# Patient Record
Sex: Female | Born: 1995
Health system: Southern US, Community
[De-identification: ages and names within clinical notes are randomized; demographics above are authoritative.]

## PROBLEM LIST (undated history)

## (undated) DIAGNOSIS — D509 Iron deficiency anemia, unspecified: Secondary | ICD-10-CM

## (undated) DIAGNOSIS — R519 Headache, unspecified: Secondary | ICD-10-CM

## (undated) DIAGNOSIS — G90A Postural orthostatic tachycardia syndrome (POTS): Secondary | ICD-10-CM

## (undated) DIAGNOSIS — E282 Polycystic ovarian syndrome: Secondary | ICD-10-CM

## (undated) DIAGNOSIS — E039 Hypothyroidism, unspecified: Secondary | ICD-10-CM

## (undated) DIAGNOSIS — I498 Other specified cardiac arrhythmias: Secondary | ICD-10-CM

## (undated) DIAGNOSIS — R000 Tachycardia, unspecified: Secondary | ICD-10-CM

## (undated) DIAGNOSIS — I951 Orthostatic hypotension: Secondary | ICD-10-CM

## (undated) DIAGNOSIS — N809 Endometriosis, unspecified: Secondary | ICD-10-CM

## (undated) DIAGNOSIS — K219 Gastro-esophageal reflux disease without esophagitis: Secondary | ICD-10-CM

## (undated) DIAGNOSIS — I9589 Other hypotension: Secondary | ICD-10-CM

## (undated) HISTORY — PX: ENDOMETRIAL ABLATION: SHX621

## (undated) HISTORY — PX: TONSILLECTOMY: SUR1361

## (undated) HISTORY — DX: Hypothyroidism, unspecified: E03.9

## (undated) HISTORY — DX: Iron deficiency anemia, unspecified: D50.9

---
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
## 2014-11-22 ENCOUNTER — Encounter (HOSPITAL_COMMUNITY): Payer: Self-pay | Admitting: *Deleted

## 2014-11-22 ENCOUNTER — Emergency Department (HOSPITAL_COMMUNITY)
Admission: EM | Admit: 2014-11-22 | Discharge: 2014-11-23 | Disposition: A | Discharge status: Home / Self Care | Payer: BLUE CROSS/BLUE SHIELD | Attending: Emergency Medicine | Admitting: Emergency Medicine

## 2014-11-22 DIAGNOSIS — Y92009 Unspecified place in unspecified non-institutional (private) residence as the place of occurrence of the external cause: Secondary | ICD-10-CM | POA: Diagnosis not present

## 2014-11-22 DIAGNOSIS — S0990XA Unspecified injury of head, initial encounter: Secondary | ICD-10-CM

## 2014-11-22 DIAGNOSIS — R5383 Other fatigue: Secondary | ICD-10-CM | POA: Diagnosis not present

## 2014-11-22 DIAGNOSIS — Y9389 Activity, other specified: Secondary | ICD-10-CM | POA: Diagnosis not present

## 2014-11-22 DIAGNOSIS — W01198A Fall on same level from slipping, tripping and stumbling with subsequent striking against other object, initial encounter: Secondary | ICD-10-CM | POA: Insufficient documentation

## 2014-11-22 DIAGNOSIS — S99922A Unspecified injury of left foot, initial encounter: Secondary | ICD-10-CM | POA: Insufficient documentation

## 2014-11-22 DIAGNOSIS — Y998 Other external cause status: Secondary | ICD-10-CM | POA: Diagnosis not present

## 2014-11-22 HISTORY — DX: Orthostatic hypotension: I95.1

## 2014-11-22 HISTORY — DX: Postural orthostatic tachycardia syndrome (POTS): G90.A

## 2014-11-22 HISTORY — DX: Other specified cardiac arrhythmias: I49.8

## 2014-11-22 HISTORY — DX: Tachycardia, unspecified: R00.0

## 2014-11-22 NOTE — ED Notes (Signed)
Pt states that she slipped on ice in the floor this evening hitting her head against the bottom of the refrigerator, pt c/o pain to left foot and frontal head pain, pt states that after the fall when she started to get up she felt tired and nauseous,

## 2014-11-22 NOTE — ED Provider Notes (Signed)
CSN: [id number]     Arrival date & time 11/22/14  2115 History   First MD Initiated Contact with Patient 11/22/14 2336     Chief Complaint  Patient presents with  . Fall     (Consider location/radiation/quality/duration/timing/severity/associated sxs/prior Treatment) HPI   Joy Heath is a 19 y.o. female who presents to the Emergency Department complaining of a mechanical fall inside her house that occurred approximately 3 hours prior to ED arrival. She states that slipped on some ice in the floor and struck her left foot on the refrigerator and struck the front of her head on the metal strip between the carpet and the tile flooring.  She denies LOC, neck pain, vomiting, dizziness and visual changes.  Initially, she states that she felt nauseous and tired, but now symptoms have resolved.  She has not taken any medications for her symptoms   Past Medical History  Diagnosis Date  . POTS (postural orthostatic tachycardia syndrome)    Past Surgical History  Procedure Laterality Date  . Endometrial ablation    . Tonsillectomy     No family history on file. History  Substance Use Topics  . Smoking status: Never Smoker   . Smokeless tobacco: Not on file  . Alcohol Use: No   OB History    No data available     Review of Systems  Constitutional: Positive for fatigue. Negative for fever, activity change and appetite change.  HENT: Negative for facial swelling.   Eyes: Negative for pain and visual disturbance.  Respiratory: Negative for shortness of breath.   Cardiovascular: Negative for chest pain.  Gastrointestinal: Positive for nausea. Negative for vomiting and abdominal pain.  Musculoskeletal: Negative for neck pain and neck stiffness.  Skin: Negative for rash and wound.  Neurological: Positive for headaches. Negative for dizziness, seizures, syncope, facial asymmetry, speech difficulty, weakness and numbness.  Psychiatric/Behavioral: Negative for confusion and decreased  concentration.  All other systems reviewed and are negative.     Allergies  Review of patient's allergies indicates no known allergies.  Home Medications   Prior to Admission medications   Not on File   BP 128/70 mmHg  Pulse 84  Temp(Src) 98.2 F (36.8 C) (Oral)  Resp 24  Ht 5\' 5"  (1.651 m)  Wt 230 lb (104.327 kg)  BMI 38.27 kg/m2  SpO2 100%  LMP 11/09/2014   Physical Exam  Constitutional: She is oriented to person, place, and time. She appears well-developed and well-nourished. No distress.  HENT:  Head: Normocephalic and atraumatic.  Mouth/Throat: Oropharynx is clear and moist.  Eyes: Conjunctivae and EOM are normal. Pupils are equal, round, and reactive to light.  Neck: Normal range of motion. Neck supple.  Cardiovascular: Normal rate, regular rhythm, normal heart sounds and intact distal pulses.   No murmur heard. Pulmonary/Chest: Effort normal and breath sounds normal. No respiratory distress.  Musculoskeletal: Normal range of motion. She exhibits no edema.  Neurological: She is alert and oriented to person, place, and time.  Skin: Skin is warm and dry.  Psychiatric: She has a normal mood and affect. Her behavior is normal. Thought content normal.  Nursing note and vitals reviewed.   ED Course  Procedures (including critical care time) Labs Review Labs Reviewed - No data to display  Imaging Review No results found.   EKG Interpretation None      MDM   Final diagnoses:  Minor head injury without loss of consciousness, initial encounter    Pt is well appearing, vitals  stable.  Steady gait.  No focal neuro deficits.  Requesting to go home, states that she is feeling better and symptoms have resolved.  Clinical suspicion for concussion or subdural hematoma are low.  I have discussed possible risks of head injury and concussion symptoms with the patient and she agrees to return here for any worsening symptoms, family also agree to closely observe.      Kem Parkinson, PA-C 05/16/74 [id number]  Delora Fuel, MD 54/98/26 [id number]

## 2014-11-23 NOTE — ED Notes (Signed)
Patient verbalizes understanding of discharge instructions, home care and follow up care. Patient out of department at this time with family. 

## 2014-11-23 NOTE — Discharge Instructions (Signed)
Concussion A concussion is a brain injury. It is caused by:  A hit to the head.  A quick and sudden movement (jolt) of the head or neck. A concussion is usually not life threatening. Even so, it can cause serious problems. If you had a concussion before, you may have concussion-like problems after a hit to your head. HOME CARE General Instructions  Follow your doctor's directions carefully.  Take medicines only as told by your doctor.  Only take medicines your doctor says are safe.  Do not drink alcohol until your doctor says it is okay. Alcohol and some drugs can slow down healing. They can also put you at risk for further injury.  If you are having trouble remembering things, write them down.  Try to do one thing at a time if you get distracted easily. For example, do not watch TV while making dinner.  Talk to your family members or close friends when making important decisions.  Follow up with your doctor as told.  Watch your symptoms. Tell others to do the same. Serious problems can sometimes happen after a concussion. Older adults are more likely to have these problems.  Tell your teachers, school nurse, school counselor, coach, Product/process development scientist, or work Freight forwarder about your concussion. Tell them about what you can or cannot do. They should watch to see if:  It gets even harder for you to pay attention or concentrate.  It gets even harder for you to remember things or learn new things.  You need more time than normal to finish things.  You become annoyed (irritable) more than before.  You are not able to deal with stress as well.  You have more problems than before.  Rest. Make sure you:  Get plenty of sleep at night.  Go to sleep early.  Go to bed at the same time every day. Try to wake up at the same time.  Rest during the day.  Take naps when you feel tired.  Limit activities where you have to think a lot or concentrate. These include:  Doing  homework.  Doing work related to a job.  Watching TV.  Using the computer. Returning To Your Regular Activities Return to your normal activities slowly, not all at once. You must give your body and brain enough time to heal.   Do not play sports or do other athletic activities until your doctor says it is okay.  Ask your doctor when you can drive, ride a bicycle, or work other vehicles or machines. Never do these things if you feel dizzy.  Ask your doctor about when you can return to work or school. Preventing Another Concussion It is very important to avoid another brain injury, especially before you have healed. In rare cases, another injury can lead to permanent brain damage, brain swelling, or death. The risk of this is greatest during the first 7-10 days after your injury. Avoid injuries by:   Wearing a seat belt when riding in a car.  Not drinking too much alcohol.  Avoiding activities that could lead to a second concussion (such as contact sports).  Wearing a helmet when doing activities like:  Biking.  Skiing.  Skateboarding.  Skating.  Making your home safer by:  Removing things from the floor or stairways that could make you trip.  Using grab bars in bathrooms and handrails by stairs.  Placing non-slip mats on floors and in bathtubs.  Improve lighting in dark areas. GET HELP IF:  It  gets even harder for you to pay attention or concentrate. °· It gets even harder for you to remember things or learn new things. °· You need more time than normal to finish things. °· You become annoyed (irritable) more than before. °· You are not able to deal with stress as well. °· You have more problems than before. °· You have problems keeping your balance. °· You are not able to react quickly when you should. °Get help if you have any of these problems for more than 2 weeks:  °· Lasting (chronic) headaches. °· Dizziness or trouble balancing. °· Feeling sick to your stomach  (nausea). °· Seeing (vision) problems. °· Being affected by noises or light more than normal. °· Feeling sad, low, down in the dumps, blue, gloomy, or empty (depressed). °· Mood changes (mood swings). °· Feeling of fear or nervousness about what may happen (anxiety). °· Feeling annoyed. °· Memory problems. °· Problems concentrating or paying attention. °· Sleep problems. °· Feeling tired all the time. °GET HELP RIGHT AWAY IF:  °· You have bad headaches or your headaches get worse. °· You have weakness (even if it is in one hand, leg, or part of the face). °· You have loss of feeling (numbness). °· You feel off balance. °· You keep throwing up (vomiting). °· You feel tired. °· One black center of your eye (pupil) is larger than the other. °· You twitch or shake violently (convulse). °· Your speech is not clear (slurred). °· You are more confused, easily angered (agitated), or annoyed than before. °· You have more trouble resting than before. °· You are unable to recognize people or places. °· You have neck pain. °· It is difficult to wake you up. °· You have unusual behavior changes. °· You pass out (lose consciousness). °MAKE SURE YOU:  °· Understand these instructions. °· Will watch your condition. °· Will get help right away if you are not doing well or get worse. °Document Released: 05/03/2009 Document Revised: 09/29/2013 Document Reviewed: 12/05/2012 °[employer]® Patient Information ©2015 [employer]. This information is not intended to replace advice given to you by your health care provider. Make sure you discuss any questions you have with your health care provider. ° °

## 2014-11-23 NOTE — ED Notes (Signed)
Patient verbalizes understanding of discharge instructions, home care and follow up care if needed. Patient out of department at this time with family. 

## 2015-10-09 ENCOUNTER — Emergency Department (HOSPITAL_COMMUNITY): Payer: BLUE CROSS/BLUE SHIELD

## 2015-10-09 ENCOUNTER — Encounter (HOSPITAL_COMMUNITY): Payer: Self-pay | Admitting: *Deleted

## 2015-10-09 ENCOUNTER — Emergency Department (HOSPITAL_COMMUNITY)
Admission: EM | Admit: 2015-10-09 | Discharge: 2015-10-10 | Disposition: A | Discharge status: Home / Self Care | Payer: BLUE CROSS/BLUE SHIELD | Attending: Emergency Medicine | Admitting: Emergency Medicine

## 2015-10-09 DIAGNOSIS — R112 Nausea with vomiting, unspecified: Secondary | ICD-10-CM | POA: Diagnosis not present

## 2015-10-09 DIAGNOSIS — Z79899 Other long term (current) drug therapy: Secondary | ICD-10-CM | POA: Diagnosis not present

## 2015-10-09 DIAGNOSIS — R197 Diarrhea, unspecified: Secondary | ICD-10-CM | POA: Insufficient documentation

## 2015-10-09 DIAGNOSIS — Z7984 Long term (current) use of oral hypoglycemic drugs: Secondary | ICD-10-CM | POA: Insufficient documentation

## 2015-10-09 DIAGNOSIS — R1084 Generalized abdominal pain: Secondary | ICD-10-CM | POA: Insufficient documentation

## 2015-10-09 HISTORY — DX: Polycystic ovarian syndrome: E28.2

## 2015-10-09 LAB — CBC
HCT: 38.3 % (ref 36.0–46.0)
Hemoglobin: 12.6 g/dL (ref 12.0–15.0)
MCH: 26.1 pg (ref 26.0–34.0)
MCHC: 32.9 g/dL (ref 30.0–36.0)
MCV: 79.3 fL (ref 78.0–100.0)
PLATELETS: 258 10*3/uL (ref 150–400)
RBC: 4.83 MIL/uL (ref 3.87–5.11)
RDW: 13.3 % (ref 11.5–15.5)
WBC: 10.2 10*3/uL (ref 4.0–10.5)

## 2015-10-09 LAB — URINALYSIS, ROUTINE W REFLEX MICROSCOPIC
Bilirubin Urine: NEGATIVE
Glucose, UA: NEGATIVE mg/dL
HGB URINE DIPSTICK: NEGATIVE
KETONES UR: NEGATIVE mg/dL
Leukocytes, UA: NEGATIVE
Nitrite: NEGATIVE
PROTEIN: NEGATIVE mg/dL
Specific Gravity, Urine: 1.01 (ref 1.005–1.030)
pH: 6 (ref 5.0–8.0)

## 2015-10-09 LAB — COMPREHENSIVE METABOLIC PANEL
ALK PHOS: 45 U/L (ref 38–126)
ALT: 18 U/L (ref 14–54)
AST: 16 U/L (ref 15–41)
Albumin: 4.3 g/dL (ref 3.5–5.0)
Anion gap: 8 (ref 5–15)
BUN: 12 mg/dL (ref 6–20)
CALCIUM: 9.1 mg/dL (ref 8.9–10.3)
CO2: 20 mmol/L — AB (ref 22–32)
CREATININE: 0.72 mg/dL (ref 0.44–1.00)
Chloride: 105 mmol/L (ref 101–111)
Glucose, Bld: 85 mg/dL (ref 65–99)
Potassium: 3.6 mmol/L (ref 3.5–5.1)
SODIUM: 133 mmol/L — AB (ref 135–145)
Total Bilirubin: 0.4 mg/dL (ref 0.3–1.2)
Total Protein: 8.2 g/dL — ABNORMAL HIGH (ref 6.5–8.1)

## 2015-10-09 LAB — LIPASE, BLOOD: Lipase: 27 U/L (ref 11–51)

## 2015-10-09 LAB — POC URINE PREG, ED: PREG TEST UR: NEGATIVE

## 2015-10-09 MED ORDER — DIATRIZOATE MEGLUMINE & SODIUM 66-10 % PO SOLN
ORAL | Status: AC
  Administered 2015-10-10: 15 mL
  Filled 2015-10-09: qty 30

## 2015-10-09 MED ORDER — ONDANSETRON HCL 4 MG/2ML IJ SOLN
4.0000 mg | Freq: Once | INTRAMUSCULAR | Status: AC
  Administered 2015-10-09: 4 mg via INTRAVENOUS
  Filled 2015-10-09: qty 2

## 2015-10-09 NOTE — ED Notes (Signed)
Pt reports abdominal pain and dry heaving x 3 weeks. Pt also reports having bouts of diarrhea almost everyday x 3 weeks. Mother and pt states that the pt has lost about 30 lbs in 3 weeks. Soonest the patient could see Dr. Nevada Crane is May 22 nd, 2017.

## 2015-10-09 NOTE — ED Provider Notes (Signed)
CSN: [id number]     Arrival date & time 10/09/15  2228 History   First MD Initiated Contact with Patient 10/09/15 2234     Chief Complaint  Patient presents with  . Abdominal Pain     (Consider location/radiation/quality/duration/timing/severity/associated sxs/prior Treatment) Patient is a 20 y.o. female presenting with abdominal pain. The history is provided by the patient. No language interpreter was used.  Abdominal Pain Pain location:  Generalized Pain quality: bloating, burning, dull and sharp   Pain radiates to:  Back Pain severity:  Severe Onset quality:  Gradual Duration:  3 weeks Timing:  Constant Progression:  Worsening Chronicity:  New Relieved by:  Nothing Worsened by:  Eating Ineffective treatments:  Acetaminophen Associated symptoms: chills, diarrhea, nausea and vomiting    Joy Heath is a 20 y.o. female who presents to the ED with n/v/d that started 3 weeks ago. She reports that she vomits 2 or 3 times a day, but nothing really comes up, and then she has one dark brown watery stool each day. She reports a weight loss of about 30 pounds. 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 Tonight she felt worse so her mother brought her to the ED.  Past Medical History  Diagnosis Date  . POTS (postural orthostatic tachycardia syndrome)   . PCOS (polycystic ovarian syndrome)    Past Surgical History  Procedure Laterality Date  . Endometrial ablation    . Tonsillectomy     History reviewed. No pertinent family history. Social History  Substance Use Topics  . Smoking status: Never Smoker   . Smokeless tobacco: None  . Alcohol Use: No   OB History    No data available     Review of Systems  Constitutional: Positive for chills.  Gastrointestinal: Positive for nausea, vomiting, abdominal pain and diarrhea.  all other systems negative    Allergies  Review of patient's allergies indicates no known  allergies.  Home Medications   Prior to Admission medications   Medication Sig Start Date End Date Taking? Authorizing Provider  acetaminophen (TYLENOL) 500 MG tablet Take 500 mg by mouth every 6 (six) hours as needed for mild pain, moderate pain or headache.   Yes Historical Provider, MD  metFORMIN (GLUCOPHAGE-XR) 500 MG 24 hr tablet Take 500 mg by mouth 3 (three) times daily. 08/31/15  Yes Historical Provider, MD  NUVARING 0.12-0.015 MG/24HR vaginal ring Place vaginally daily. 08/24/15  Yes Historical Provider, MD  famotidine (PEPCID) 20 MG tablet Take 1 tablet (20 mg total) by mouth 2 (two) times daily. 10/10/15   Gaynor Ferreras Bunnie Pion, NP  ondansetron (ZOFRAN ODT) 4 MG disintegrating tablet Take 1 tablet (4 mg total) by mouth every 8 (eight) hours as needed for nausea or vomiting. 10/10/15   Pellegrino Kennard Bunnie Pion, NP   BP 115/48 mmHg  Pulse 62  Temp(Src) 98.1 F (36.7 C) (Oral)  Resp 16  Ht 5\' 5"  (1.651 m)  Wt 108.863 kg  BMI 39.94 kg/m2  SpO2 100%  LMP 09/12/2015 Physical Exam  Constitutional: She is oriented to person, place, and time. She appears well-developed and well-nourished. No distress.  HENT:  Head: Normocephalic and atraumatic.  Eyes: EOM are normal.  Neck: Neck supple.  Cardiovascular: Normal rate and regular rhythm.   Pulmonary/Chest: Effort normal. No respiratory distress. She has no wheezes. She has no rales.  Abdominal: Soft. Bowel sounds are normal. There is generalized tenderness. There is no rigidity,  no rebound, no guarding and no CVA tenderness.  Musculoskeletal: Normal range of motion.  Neurological: She is alert and oriented to person, place, and time. No cranial nerve deficit.  Skin: Skin is warm and dry.  Psychiatric: She has a normal mood and affect. Her behavior is normal.  Nursing note and vitals reviewed.   ED Course  Procedures (including critical care time) Labs Review Results for orders placed or performed during the hospital encounter of 10/09/15 (from the past  24 hour(s))  Lipase, blood     Status: None   Collection Time: 10/09/15 10:55 PM  Result Value Ref Range   Lipase 27 11 - 51 U/L  Comprehensive metabolic panel     Status: Abnormal   Collection Time: 10/09/15 10:55 PM  Result Value Ref Range   Sodium 133 (L) 135 - 145 mmol/L   Potassium 3.6 3.5 - 5.1 mmol/L   Chloride 105 101 - 111 mmol/L   CO2 20 (L) 22 - 32 mmol/L   Glucose, Bld 85 65 - 99 mg/dL   BUN 12 6 - 20 mg/dL   Creatinine, Ser 0.72 0.44 - 1.00 mg/dL   Calcium 9.1 8.9 - 10.3 mg/dL   Total Protein 8.2 (H) 6.5 - 8.1 g/dL   Albumin 4.3 3.5 - 5.0 g/dL   AST 16 15 - 41 U/L   ALT 18 14 - 54 U/L   Alkaline Phosphatase 45 38 - 126 U/L   Total Bilirubin 0.4 0.3 - 1.2 mg/dL   GFR calc non Af Amer >60 >60 mL/min   GFR calc Af Amer >60 >60 mL/min   Anion gap 8 5 - 15  CBC     Status: None   Collection Time: 10/09/15 10:55 PM  Result Value Ref Range   WBC 10.2 4.0 - 10.5 K/uL   RBC 4.83 3.87 - 5.11 MIL/uL   Hemoglobin 12.6 12.0 - 15.0 g/dL   HCT 38.3 36.0 - 46.0 %   MCV 79.3 78.0 - 100.0 fL   MCH 26.1 26.0 - 34.0 pg   MCHC 32.9 30.0 - 36.0 g/dL   RDW 13.3 11.5 - 15.5 %   Platelets 258 150 - 400 K/uL  Urinalysis, Routine w reflex microscopic     Status: None   Collection Time: 10/09/15 10:55 PM  Result Value Ref Range   Color, Urine YELLOW YELLOW   APPearance CLEAR CLEAR   Specific Gravity, Urine 1.010 1.005 - 1.030   pH 6.0 5.0 - 8.0   Glucose, UA NEGATIVE NEGATIVE mg/dL   Hgb urine dipstick NEGATIVE NEGATIVE   Bilirubin Urine NEGATIVE NEGATIVE   Ketones, ur NEGATIVE NEGATIVE mg/dL   Protein, ur NEGATIVE NEGATIVE mg/dL   Nitrite NEGATIVE NEGATIVE   Leukocytes, UA NEGATIVE NEGATIVE  POC urine preg, ED     Status: None   Collection Time: 10/09/15 11:07 PM  Result Value Ref Range   Preg Test, Ur NEGATIVE NEGATIVE    Imaging Review Ct Abdomen Pelvis W Contrast  10/10/2015  CLINICAL DATA:  Abdominal pain, nausea and vomiting for 3 weeks. EXAM: CT ABDOMEN AND PELVIS  WITH CONTRAST TECHNIQUE: Multidetector CT imaging of the abdomen and pelvis was performed using the standard protocol following bolus administration of intravenous contrast. CONTRAST:  140mL ISOVUE-300 IOPAMIDOL (ISOVUE-300) INJECTION 61% COMPARISON:  None. FINDINGS: Lower chest:  No significant abnormality Hepatobiliary: There are normal appearances of the liver, gallbladder and bile ducts. Pancreas: Normal Spleen: Normal Adrenals/Urinary Tract: The adrenals and kidneys are normal in appearance. There  is no urinary calculus evident. There is no hydronephrosis or ureteral dilatation. Collecting systems and ureters appear unremarkable. Stomach/Bowel: There are normal appearances of the stomach, small bowel and colon. The appendix is normal. Vascular/Lymphatic: The abdominal aorta is normal in caliber. There is no atherosclerotic calcification. There is no adenopathy in the abdomen or pelvis. Reproductive: Unremarkable uterus and ovaries. Other: No acute inflammatory changes are evident in the abdomen or pelvis. There is no ascites. Incidental findings include a small fat containing umbilical hernia. Musculoskeletal: No significant musculoskeletal lesion. IMPRESSION: No significant abnormality is evident in the abdomen or pelvis. Electronically Signed   By: Andreas Newport M.D.   On: 10/10/2015 00:31   I have personally reviewed and evaluated these images and lab results as part of my medical decision-making.   MDM  20 y.o. female with n/v/d and abdominal pain stable for d/c with normal labs and normal CT. Patient without fever and does not appear toxic. Will treat with Zofran for nausea, instructions for Clear liquids and then advance to the BRAT diet. She will f/u with her PCP as scheduled or return here for worsening symptoms.   Final diagnoses:  Generalized abdominal pain  Nausea vomiting and diarrhea       Ashley Murrain, NP 10/10/15 0105  Noemi Chapel, MD 10/12/15 [phone]

## 2015-10-10 MED ORDER — IOPAMIDOL (ISOVUE-300) INJECTION 61%
100.0000 mL | Freq: Once | INTRAVENOUS | Status: AC | PRN
  Administered 2015-10-10: 100 mL via INTRAVENOUS

## 2015-10-10 MED ORDER — FAMOTIDINE 20 MG PO TABS: 20.0000 mg | ORAL_TABLET | Freq: Two times a day (BID) | ORAL | Status: DC

## 2015-10-10 MED ORDER — ONDANSETRON 4 MG PO TBDP: 4.0000 mg | ORAL_TABLET | Freq: Three times a day (TID) | ORAL | Status: DC | PRN

## 2015-10-10 NOTE — ED Notes (Signed)
Pt alert & oriented x4, stable gait. Patient given discharge instructions, paperwork & prescription(s). Patient  instructed to stop at the registration desk to finish any additional paperwork. Patient verbalized understanding. Pt left department w/ no further questions. 

## 2015-10-10 NOTE — Discharge Instructions (Signed)
Your labs and CT scan tonight are normal. We are treating your symptoms with medication for nausea and vomiting and Pepcid for epigastric tenderness. Stay on clear liquids for the next 12 hours and then advance to the [place]. Follow up with your doctor as scheduled. Return here for worsening symptoms.

## 2015-10-21 ENCOUNTER — Other Ambulatory Visit (HOSPITAL_COMMUNITY): Payer: Self-pay | Admitting: Internal Medicine

## 2015-10-21 DIAGNOSIS — R109 Unspecified abdominal pain: Secondary | ICD-10-CM

## 2015-10-21 DIAGNOSIS — R112 Nausea with vomiting, unspecified: Secondary | ICD-10-CM

## 2015-10-27 ENCOUNTER — Encounter (HOSPITAL_COMMUNITY)
Admission: RE | Admit: 2015-10-27 | Discharge: 2015-10-27 | Disposition: A | Discharge status: Home / Self Care | Payer: BLUE CROSS/BLUE SHIELD | Source: Ambulatory Visit | Attending: Internal Medicine | Admitting: Internal Medicine

## 2015-10-27 ENCOUNTER — Encounter (HOSPITAL_COMMUNITY): Payer: Self-pay

## 2015-10-27 DIAGNOSIS — R109 Unspecified abdominal pain: Secondary | ICD-10-CM | POA: Diagnosis present

## 2015-10-27 DIAGNOSIS — R112 Nausea with vomiting, unspecified: Secondary | ICD-10-CM | POA: Diagnosis not present

## 2015-10-27 MED ORDER — TECHNETIUM TC 99M MEBROFENIN IV KIT
5.0000 | PACK | Freq: Once | INTRAVENOUS | Status: AC | PRN
  Administered 2015-10-27: 5 via INTRAVENOUS

## 2018-12-09 ENCOUNTER — Other Ambulatory Visit: Payer: Self-pay

## 2018-12-09 ENCOUNTER — Encounter (HOSPITAL_COMMUNITY): Payer: Self-pay | Admitting: Emergency Medicine

## 2018-12-09 ENCOUNTER — Emergency Department (HOSPITAL_COMMUNITY): Payer: Medicaid Other

## 2018-12-09 ENCOUNTER — Emergency Department (HOSPITAL_COMMUNITY)
Admission: EM | Admit: 2018-12-09 | Discharge: 2018-12-09 | Disposition: A | Discharge status: Home / Self Care | Payer: Medicaid Other | Attending: Emergency Medicine | Admitting: Emergency Medicine

## 2018-12-09 DIAGNOSIS — R0602 Shortness of breath: Secondary | ICD-10-CM

## 2018-12-09 DIAGNOSIS — Z20828 Contact with and (suspected) exposure to other viral communicable diseases: Secondary | ICD-10-CM | POA: Insufficient documentation

## 2018-12-09 LAB — CBC WITH DIFFERENTIAL/PLATELET
Abs Immature Granulocytes: 0.03 10*3/uL (ref 0.00–0.07)
Basophils Absolute: 0 10*3/uL (ref 0.0–0.1)
Basophils Relative: 0 %
Eosinophils Absolute: 0.4 10*3/uL (ref 0.0–0.5)
Eosinophils Relative: 5 %
HCT: 42.6 % (ref 36.0–46.0)
Hemoglobin: 13.8 g/dL (ref 12.0–15.0)
Immature Granulocytes: 0 %
Lymphocytes Relative: 27 %
Lymphs Abs: 2.6 10*3/uL (ref 0.7–4.0)
MCH: 26.3 pg (ref 26.0–34.0)
MCHC: 32.4 g/dL (ref 30.0–36.0)
MCV: 81.1 fL (ref 80.0–100.0)
Monocytes Absolute: 0.6 10*3/uL (ref 0.1–1.0)
Monocytes Relative: 6 %
Neutro Abs: 6 10*3/uL (ref 1.7–7.7)
Neutrophils Relative %: 62 %
Platelets: 296 10*3/uL (ref 150–400)
RBC: 5.25 MIL/uL — ABNORMAL HIGH (ref 3.87–5.11)
RDW: 13.2 % (ref 11.5–15.5)
WBC: 9.7 10*3/uL (ref 4.0–10.5)
nRBC: 0 % (ref 0.0–0.2)

## 2018-12-09 LAB — COMPREHENSIVE METABOLIC PANEL
ALT: 17 U/L (ref 0–44)
AST: 16 U/L (ref 15–41)
Albumin: 4.2 g/dL (ref 3.5–5.0)
Alkaline Phosphatase: 59 U/L (ref 38–126)
Anion gap: 10 (ref 5–15)
BUN: 14 mg/dL (ref 6–20)
CO2: 21 mmol/L — ABNORMAL LOW (ref 22–32)
Calcium: 9 mg/dL (ref 8.9–10.3)
Chloride: 104 mmol/L (ref 98–111)
Creatinine, Ser: 0.74 mg/dL (ref 0.44–1.00)
GFR calc Af Amer: >60 mL/min (ref >60)
GFR calc non Af Amer: >60 mL/min (ref >60)
Glucose, Bld: 117 mg/dL — ABNORMAL HIGH (ref 70–99)
Potassium: 4 mmol/L (ref 3.5–5.1)
Sodium: 135 mmol/L (ref 135–145)
Total Bilirubin: 0.2 mg/dL — ABNORMAL LOW (ref 0.3–1.2)
Total Protein: 8.4 g/dL — ABNORMAL HIGH (ref 6.5–8.1)

## 2018-12-09 LAB — D-DIMER, QUANTITATIVE (NOT AT ARMC): D-Dimer, Quant: 0.45 ug/mL-FEU (ref 0.00–0.50)

## 2018-12-09 LAB — CBG MONITORING, ED: Glucose-Capillary: 113 mg/dL — ABNORMAL HIGH (ref 70–99)

## 2018-12-09 MED ORDER — ALBUTEROL SULFATE HFA 108 (90 BASE) MCG/ACT IN AERS
2.0000 | INHALATION_SPRAY | Freq: Once | RESPIRATORY_TRACT | Status: AC
  Administered 2018-12-09: 2 via RESPIRATORY_TRACT
  Filled 2018-12-09: qty 6.7

## 2018-12-09 MED ORDER — AEROCHAMBER PLUS FLO-VU MEDIUM MISC
1.0000 | Freq: Once | Status: AC
  Administered 2018-12-09: 1
  Filled 2018-12-09 (×2): qty 1

## 2018-12-09 NOTE — Discharge Instructions (Signed)
You were given a prescription for an albuterol inhaler.  Please take 2 puffs 4 to 6 hours as needed for shortness of breath or chest tightness.  You should be isolated for at least 7 days since the onset of your symptoms AND >72 hours after symptoms resolution (absence of fever without the use of fever reducing medication and improvement in respiratory symptoms), whichever is longer  Please follow-up with your regular doctor in the next 3 to 5 days for reevaluation.  If you have any new or worsening symptoms in the meantime you should return to the emergency department immediately.

## 2018-12-09 NOTE — ED Provider Notes (Signed)
[facility] EMERGENCY DEPARTMENT Provider Note   CSN: [id number] Arrival date & time: 12/09/18  1411    History   Chief Complaint Chief Complaint  Patient presents with  . Shortness of Breath    HPI Joy Heath is a 23 y.o. female.     HPI  Pt is a 23 y/o female with a h/o PCOS, POTS who presents to the ED today for eval of SOB. States that this occurred suddenly while she was sitting down to eat. She reports associated palpitations. Later on she became fatigued and had some associated lightheadedness.  She also reports pain to the left side of her back that has been present for the last several days. Pain is intermittent. She has also had some intermittent chest discomfort to the left side of the chest. Feels like a stabbing pain that is not specifically associated with exertion. Denies any coughing or fevers.  She reports recent diarrhea. Reports chronic LLQ and pain, but no new abd pain. Also has nausea, but no vomiting. Denies dysuria.  Denies any visual changes, unilateral numbness/weakness, headaches, ataxia, vertigo.  Denies leg pain/swelling, hemoptysis, recent surgery/trauma, recent long travel, personal hx of cancer, or hx of DVT/PE. Pt is on OCPs.   Denies any known COVID contacts.   Past Medical History:  Diagnosis Date  . Iron deficiency anemia   . PCOS (polycystic ovarian syndrome)   . POTS (postural orthostatic tachycardia syndrome)     There are no active problems to display for this patient.   Past Surgical History:  Procedure Laterality Date  . TONSILLECTOMY       OB History   No obstetric history on file.      Home Medications    Prior to Admission medications   Medication Sig Start Date End Date Taking? Authorizing Provider  cetirizine-pseudoephedrine (ZYRTEC-D) 5-120 MG tablet Take 1 tablet by mouth daily as needed for allergies.   Yes [provider]  Norgestimate-Ethinyl Estradiol Triphasic (TRI-LO-SPRINTEC) 0.18/0.215/0.25  MG-25 MCG tab Take 1 tablet by mouth daily.   Yes [provider]    Family History No family history on file.  Social History Social History   Tobacco Use  . Smoking status: Never Smoker  . Smokeless tobacco: Never Used  Substance Use Topics  . Alcohol use: No  . Drug use: No     Allergies   Patient has no known allergies.   Review of Systems Review of Systems  Constitutional: Positive for fatigue. Negative for chills and fever.  HENT: Negative for ear pain and sore throat.   Eyes: Negative for visual disturbance.  Respiratory: Positive for shortness of breath. Negative for cough.   Cardiovascular: Positive for chest pain and palpitations.  Gastrointestinal: Negative for abdominal pain, constipation, diarrhea, nausea and vomiting.  Genitourinary: Negative for dysuria and hematuria.  Musculoskeletal: Negative for back pain.  Skin: Negative for rash.  Neurological: Positive for light-headedness. Negative for dizziness, weakness, numbness and headaches.  All other systems reviewed and are negative.   Physical Exam Updated Vital Signs BP 98/62   Pulse 86   Temp 100 F (37.8 C) (Oral)   Resp 19   Ht 5\' 5"  (1.651 m)   Wt 113.4 kg   LMP 11/23/2018   SpO2 96%   BMI 41.60 kg/m   Physical Exam Vitals signs and nursing note reviewed.  Constitutional:      General: She is not in acute distress.    Appearance: She is well-developed.  HENT:  Head: Normocephalic and atraumatic.     Mouth/Throat:     Mouth: Mucous membranes are dry.  Eyes:     Conjunctiva/sclera: Conjunctivae normal.  Neck:     Musculoskeletal: Neck supple.  Cardiovascular:     Rate and Rhythm: Normal rate and regular rhythm.     Heart sounds: Normal heart sounds. No murmur.  Pulmonary:     Effort: Pulmonary effort is normal. No respiratory distress.     Breath sounds: Normal breath sounds. No decreased breath sounds, wheezing, rhonchi or rales.  Abdominal:     General: Bowel sounds  are normal.     Palpations: Abdomen is soft.     Tenderness: There is abdominal tenderness (LLQ (chronic)). There is no guarding or rebound.  Musculoskeletal:     Right lower leg: She exhibits no tenderness. No edema.     Left lower leg: She exhibits no tenderness. No edema.  Skin:    General: Skin is warm and dry.  Neurological:     Mental Status: She is alert.     Comments: Mental Status:  Alert, thought content appropriate, able to give a coherent history. Speech fluent without evidence of aphasia. Able to follow 2 step commands without difficulty.  Cranial Nerves:  II:  pupils equal, round, reactive to light III,IV, VI: ptosis not present, extra-ocular motions intact bilaterally  V,VII: smile symmetric, facial light touch sensation equal VIII: hearing grossly normal to voice  X: uvula elevates symmetrically  XI: bilateral shoulder shrug symmetric and strong XII: midline tongue extension without fassiculations Motor:  Normal tone. 5/5 strength of BUE and BLE major muscle groups including strong and equal grip strength and dorsiflexion/plantar flexion Sensory: light touch normal in all extremities. Cerebellar: normal finger-to-nose with bilateral upper extremities Gait: normal gait and balance.  CV: 2+ radial and PT pulses      ED Treatments / Results  Labs (all labs ordered are listed, but only abnormal results are displayed) Labs Reviewed  CBC WITH DIFFERENTIAL/PLATELET - Abnormal; Notable for the following components:      Result Value   RBC 5.25 (*)    All other components within normal limits  COMPREHENSIVE METABOLIC PANEL - Abnormal; Notable for the following components:   CO2 21 (*)    Glucose, Bld 117 (*)    Total Protein 8.4 (*)    Total Bilirubin 0.2 (*)    All other components within normal limits  CBG MONITORING, ED - Abnormal; Notable for the following components:   Glucose-Capillary 113 (*)    All other components within normal limits  NOVEL CORONAVIRUS,  NAA (HOSPITAL ORDER, SEND-OUT TO REF LAB)  D-DIMER, QUANTITATIVE (NOT AT [facility])    EKG None  Radiology Dg Chest 2 View  Result Date: 12/09/2018 CLINICAL DATA:  Shortness of breath with cardiac palpitations EXAM: CHEST - 2 VIEW COMPARISON:  None. FINDINGS: Lungs are clear. Heart size and pulmonary vascularity are normal. No adenopathy. No pneumothorax. No bone lesions. IMPRESSION: No edema or consolidation. Electronically Signed   By: Lowella Grip III M.D.   On: 12/09/2018 15:17    Procedures Procedures (including critical care time)  Medications Ordered in ED Medications  albuterol (VENTOLIN HFA) 108 (90 Base) MCG/ACT inhaler 2 puff (has no administration in time range)  AeroChamber Plus Flo-Vu Medium MISC 1 each (has no administration in time range)     Initial Impression / Assessment and Plan / ED Course  I have reviewed the triage vital signs and the nursing notes.  Pertinent labs & imaging results that were available during my care of the patient were reviewed by me and considered in my medical decision making (see chart for details).   Final Clinical Impressions(s) / ED Diagnoses   Final diagnoses:  SOB (shortness of breath)   23 y/o female presents to the ED today for eval of SOB. Associated fatigue and intermittent chest pain that is nonexertional. No cough.  Vitals with borderline hypotension which pt states is her baseline. Borderline Temp at 100F. Satting well on RA. No tachypnea. Orthostatics are negative  CBC nonacute CMP nonacute DDimer negative  CXR is WNL  EKG shows NSR with sinus arrhythmia, no ischemic changes. HR 81.   Pts workup is very reassuring. Sxs not consistent with ACS. Doubt PE with negative Ddimer. No widened mediastinum to suggest dissection. Doubt neurologic cause. She is well appearing and in no acute distress. I suspect she may have the start of a viral infection and there is concern for possible coronavirus therefore she was tested  for this in the ED. Advised to self quarantine until she receives results. Advised to use albuterol inhaler if she continues to have SOB. Advised on close f/u with pcp and to return to the Ed for any new or worsening symptoms. She voices understanding of the plan and reasons to return. All questions answered, pt stable for d/c.  ED Discharge Orders    None       Bishop Dublin 12/09/18 2011    Milton Ferguson, MD 12/11/18 [phone]

## 2018-12-09 NOTE — ED Triage Notes (Signed)
Pt states that she is short of breath and having some chest pains that started this morning. She is also having dizziness with this. She states that a hour ago she started having fatigue along with the other s/s

## 2018-12-10 LAB — NOVEL CORONAVIRUS, NAA (HOSP ORDER, SEND-OUT TO REF LAB; TAT 18-24 HRS): SARS-CoV-2, NAA: NOT DETECTED

## 2018-12-17 ENCOUNTER — Other Ambulatory Visit: Payer: Self-pay

## 2018-12-17 ENCOUNTER — Emergency Department (HOSPITAL_COMMUNITY): Payer: Medicaid Other

## 2018-12-17 ENCOUNTER — Emergency Department (HOSPITAL_COMMUNITY)
Admission: EM | Admit: 2018-12-17 | Discharge: 2018-12-17 | Disposition: A | Discharge status: Home / Self Care | Payer: Medicaid Other | Attending: Emergency Medicine | Admitting: Emergency Medicine

## 2018-12-17 ENCOUNTER — Encounter (HOSPITAL_COMMUNITY): Payer: Self-pay | Admitting: *Deleted

## 2018-12-17 DIAGNOSIS — R0602 Shortness of breath: Secondary | ICD-10-CM | POA: Diagnosis not present

## 2018-12-17 DIAGNOSIS — M546 Pain in thoracic spine: Secondary | ICD-10-CM | POA: Diagnosis not present

## 2018-12-17 DIAGNOSIS — Z79899 Other long term (current) drug therapy: Secondary | ICD-10-CM | POA: Insufficient documentation

## 2018-12-17 DIAGNOSIS — R55 Syncope and collapse: Secondary | ICD-10-CM | POA: Diagnosis not present

## 2018-12-17 LAB — COMPREHENSIVE METABOLIC PANEL
ALT: 55 U/L — ABNORMAL HIGH (ref 0–44)
AST: 25 U/L (ref 15–41)
Albumin: 4.5 g/dL (ref 3.5–5.0)
Alkaline Phosphatase: 54 U/L (ref 38–126)
Anion gap: 10 (ref 5–15)
BUN: 18 mg/dL (ref 6–20)
CO2: 21 mmol/L — ABNORMAL LOW (ref 22–32)
Calcium: 8.9 mg/dL (ref 8.9–10.3)
Chloride: 106 mmol/L (ref 98–111)
Creatinine, Ser: 0.83 mg/dL (ref 0.44–1.00)
GFR calc Af Amer: >60 mL/min (ref >60)
GFR calc non Af Amer: >60 mL/min (ref >60)
Glucose, Bld: 110 mg/dL — ABNORMAL HIGH (ref 70–99)
Potassium: 4.1 mmol/L (ref 3.5–5.1)
Sodium: 137 mmol/L (ref 135–145)
Total Bilirubin: 0.3 mg/dL (ref 0.3–1.2)
Total Protein: 8.8 g/dL — ABNORMAL HIGH (ref 6.5–8.1)

## 2018-12-17 LAB — PREGNANCY, URINE: Preg Test, Ur: NEGATIVE

## 2018-12-17 LAB — CBC WITH DIFFERENTIAL/PLATELET
Abs Immature Granulocytes: 0.07 10*3/uL (ref 0.00–0.07)
Basophils Absolute: 0 10*3/uL (ref 0.0–0.1)
Basophils Relative: 0 %
Eosinophils Absolute: 0 10*3/uL (ref 0.0–0.5)
Eosinophils Relative: 0 %
HCT: 42.9 % (ref 36.0–46.0)
Hemoglobin: 13.7 g/dL (ref 12.0–15.0)
Immature Granulocytes: 1 %
Lymphocytes Relative: 13 %
Lymphs Abs: 1.9 10*3/uL (ref 0.7–4.0)
MCH: 26 pg (ref 26.0–34.0)
MCHC: 31.9 g/dL (ref 30.0–36.0)
MCV: 81.4 fL (ref 80.0–100.0)
Monocytes Absolute: 0.4 10*3/uL (ref 0.1–1.0)
Monocytes Relative: 3 %
Neutro Abs: 12.6 10*3/uL — ABNORMAL HIGH (ref 1.7–7.7)
Neutrophils Relative %: 83 %
Platelets: 316 10*3/uL (ref 150–400)
RBC: 5.27 MIL/uL — ABNORMAL HIGH (ref 3.87–5.11)
RDW: 13.7 % (ref 11.5–15.5)
WBC: 15 10*3/uL — ABNORMAL HIGH (ref 4.0–10.5)
nRBC: 0 % (ref 0.0–0.2)

## 2018-12-17 LAB — CBG MONITORING, ED: Glucose-Capillary: 133 mg/dL — ABNORMAL HIGH (ref 70–99)

## 2018-12-17 LAB — URINALYSIS, ROUTINE W REFLEX MICROSCOPIC
Bilirubin Urine: NEGATIVE
Glucose, UA: NEGATIVE mg/dL
Ketones, ur: NEGATIVE mg/dL
Nitrite: NEGATIVE
Protein, ur: NEGATIVE mg/dL
Specific Gravity, Urine: 1.015 (ref 1.005–1.030)
pH: 6 (ref 5.0–8.0)

## 2018-12-17 LAB — MONONUCLEOSIS SCREEN: Mono Screen: NEGATIVE

## 2018-12-17 MED ORDER — ONDANSETRON 4 MG PO TBDP: 4.0000 mg | ORAL_TABLET | Freq: Three times a day (TID) | ORAL | 0 refills | Status: DC | PRN

## 2018-12-17 MED ORDER — ONDANSETRON HCL 4 MG/2ML IJ SOLN
4.0000 mg | Freq: Once | INTRAMUSCULAR | Status: AC
  Administered 2018-12-17: 4 mg via INTRAVENOUS
  Filled 2018-12-17: qty 2

## 2018-12-17 MED ORDER — IBUPROFEN 800 MG PO TABS
800.0000 mg | ORAL_TABLET | Freq: Once | ORAL | Status: AC
  Administered 2018-12-17: 800 mg via ORAL
  Filled 2018-12-17: qty 1

## 2018-12-17 MED ORDER — SODIUM CHLORIDE 0.9 % IV BOLUS
1000.0000 mL | Freq: Once | INTRAVENOUS | Status: AC
  Administered 2018-12-17: 1000 mL via INTRAVENOUS

## 2018-12-17 MED ORDER — IOHEXOL 350 MG/ML SOLN
100.0000 mL | Freq: Once | INTRAVENOUS | Status: AC | PRN
  Administered 2018-12-17: 100 mL via INTRAVENOUS

## 2018-12-17 MED ORDER — IBUPROFEN 400 MG PO TABS
400.0000 mg | ORAL_TABLET | Freq: Once | ORAL | Status: AC
  Administered 2018-12-17: 400 mg via ORAL
  Filled 2018-12-17: qty 1

## 2018-12-17 NOTE — ED Notes (Signed)
ekg done in triage

## 2018-12-17 NOTE — ED Triage Notes (Signed)
Pt with sob and LOC x2 with father with her.

## 2018-12-17 NOTE — Discharge Instructions (Addendum)
As discussed, your labs are stable and your CT exam is negative for any lung problems (no pulmonary embolus and no pneumonia).  I suspect you have a virus that should run its course soon.  Rest and make sure you are drinking plenty of fluids.  You may continue using your albuterol inhaler you were given last week as discussed.  Finish your prednisone taper.  You may use the Zofran if needed for nausea.  Follow-up with your doctor for recheck of your symptoms if not improving over the next several days.

## 2018-12-17 NOTE — ED Provider Notes (Signed)
[facility] EMERGENCY DEPARTMENT Provider Note   CSN: [id number] Arrival date & time: 12/17/18  1407     History   Chief Complaint Chief Complaint  Patient presents with   Shortness of Breath   Loss of Consciousness    HPI Joy Heath is a 23 y.o. female who presents with shortness of breath and syncope.  Past medical history significant for POTS, PCOS, anemia.  Patient states that over the past week she has had shortness of breath and feels like her heart has been racing.  She came to the emergency department last week and had a COVID test and negative d-dimer and overall reassuring work-up. She was given an inhaler.  She states that she was still feeling unwell days later so she followed up with her PCP.  They tested her for COVID again which was negative and they gave her steroids.  She has been taking this without significant relief.  She states that since she since the ED she has also had posterior head and neck pain, anterior neck pain and swelling, right ear pain.  She had a fever when she was in the emergency department last week but states that this is gone away.  She reports eating and drinking well but feels very fatigued.  Her shortness of breath is essentially unchanged.  She has a slight cough with no wheezing.  She also endorses some chest tightness but no significant or severe chest pain.  She has upper abdominal pain and nausea but no vomiting or diarrhea.  She states her urine is dark but denies any dysuria.  No calf pain but she is on OCPs.  No sick contacts. Today she was feeling slightly better however she was lying on the couch watching TV and then she passed out while just lying down so she decided that she should come back to the ED to be rechecked.   HPI  Past Medical History:  Diagnosis Date   Iron deficiency anemia    PCOS (polycystic ovarian syndrome)    POTS (postural orthostatic tachycardia syndrome)     There are no active problems to display  for this patient.   Past Surgical History:  Procedure Laterality Date   TONSILLECTOMY       OB History   No obstetric history on file.      Home Medications    Prior to Admission medications   Medication Sig Start Date End Date Taking? Authorizing Provider  albuterol (VENTOLIN HFA) 108 (90 Base) MCG/ACT inhaler Inhale 1-2 puffs into the lungs every 6 (six) hours as needed for wheezing or shortness of breath.   Yes [provider]  cetirizine-pseudoephedrine (ZYRTEC-D) 5-120 MG tablet Take 1 tablet by mouth daily as needed for allergies.   Yes [provider]  Norgestimate-Ethinyl Estradiol Triphasic (TRI-LO-SPRINTEC) 0.18/0.215/0.25 MG-25 MCG tab Take 1 tablet by mouth daily.   Yes [provider]  pantoprazole (PROTONIX) 40 MG tablet Take 40 mg by mouth daily.   Yes [provider]  predniSONE (DELTASONE) 10 MG tablet Take 10-60 mg by mouth See admin instructions. 60MG  ON DAY 1, THEN 50MG  ON DAY 2, THEN 40MG  ON DAY 3, THEN 30MG  ON DAY 4 THEN 20MG  ON DAY 5, THEN 10MG  ON DAY 6 THEN STOP. TO COMPLETE COURSE ON 12/18/2018 12/12/18  Yes [provider]    Family History History reviewed. No pertinent family history.  Social History Social History   Tobacco Use   Smoking status: Never Smoker   Smokeless  tobacco: Never Used  Substance Use Topics   Alcohol use: No   Drug use: No     Allergies   Patient has no known allergies.   Review of Systems Review of Systems  Constitutional: Positive for activity change, fatigue and fever (resolved). Negative for appetite change, chills and diaphoresis.  HENT: Positive for ear pain. Negative for congestion, rhinorrhea, sneezing, sore throat and trouble swallowing.   Eyes: Positive for visual disturbance.  Respiratory: Positive for cough, chest tightness and shortness of breath. Negative for wheezing.   Cardiovascular: Positive for palpitations. Negative for chest pain and leg swelling.    Gastrointestinal: Positive for abdominal pain and nausea. Negative for diarrhea and vomiting.  Genitourinary: Negative for difficulty urinating and dysuria.  Musculoskeletal: Positive for neck pain. Negative for back pain.  Allergic/Immunologic: Negative for immunocompromised state.  Neurological: Positive for syncope and headaches. Negative for weakness.  Hematological: Positive for adenopathy.  All other systems reviewed and are negative.    Physical Exam Updated Vital Signs BP (!) 101/48 (BP Location: Right Arm)    Pulse 64    Temp 98.1 F (36.7 C)    Resp 15    Ht 5\' 5"  (1.651 m)    Wt 122.5 kg    LMP 11/23/2018    SpO2 98%    BMI 44.93 kg/m   Physical Exam Vitals signs and nursing note reviewed.  Constitutional:      General: She is not in acute distress.    Appearance: She is well-developed. She is obese. She is not ill-appearing.     Comments: Calm, cooperative.  HENT:     Head: Normocephalic and atraumatic.     Right Ear: Tympanic membrane normal.     Left Ear: Tympanic membrane normal.     Nose: Nose normal.     Mouth/Throat:     Lips: Pink.     Mouth: Mucous membranes are moist.     Pharynx: Oropharynx is clear.     Comments: S/p tonsillectomy Eyes:     General: No scleral icterus.       Right eye: No discharge.        Left eye: No discharge.     Conjunctiva/sclera: Conjunctivae normal.     Pupils: Pupils are equal, round, and reactive to light.  Neck:     Musculoskeletal: Normal range of motion. Pain with movement present. No neck rigidity.     Comments: Diffuse, tender adenopathy over the anterior cervical and posterior occipital lymph nodes Cardiovascular:     Rate and Rhythm: Normal rate.  Pulmonary:     Effort: Pulmonary effort is normal. No respiratory distress.     Breath sounds: Normal breath sounds.  Abdominal:     General: There is no distension.     Palpations: Abdomen is soft.     Tenderness: There is no abdominal tenderness.  Musculoskeletal:      Right lower leg: No edema.     Left lower leg: No edema.  Lymphadenopathy:     Cervical: Cervical adenopathy present.  Skin:    General: Skin is warm and dry.  Neurological:     Mental Status: She is alert and oriented to person, place, and time.  Psychiatric:        Behavior: Behavior normal.      ED Treatments / Results  Labs (all labs ordered are listed, but only abnormal results are displayed) Labs Reviewed  CBC WITH DIFFERENTIAL/PLATELET - Abnormal; Notable for the following components:  Result Value   WBC 15.0 (*)    RBC 5.27 (*)    Neutro Abs 12.6 (*)    All other components within normal limits  URINALYSIS, ROUTINE W REFLEX MICROSCOPIC - Abnormal; Notable for the following components:   APPearance HAZY (*)    Hgb urine dipstick MODERATE (*)    Leukocytes,Ua TRACE (*)    Bacteria, UA RARE (*)    All other components within normal limits  COMPREHENSIVE METABOLIC PANEL - Abnormal; Notable for the following components:   CO2 21 (*)    Glucose, Bld 110 (*)    Total Protein 8.8 (*)    ALT 55 (*)    All other components within normal limits  CBG MONITORING, ED - Abnormal; Notable for the following components:   Glucose-Capillary 133 (*)    All other components within normal limits  MONONUCLEOSIS SCREEN  PREGNANCY, URINE    EKG None  Radiology Dg Chest 2 View  Result Date: 12/17/2018 CLINICAL DATA:  Shortness of breath, loss of consciousness EXAM: CHEST - 2 VIEW COMPARISON:  12/09/2018 FINDINGS: The heart size and mediastinal contours are within normal limits. Both lungs are clear. The visualized skeletal structures are unremarkable. IMPRESSION: No acute abnormality of the lungs. Electronically Signed   By: Eddie Candle M.D.   On: 12/17/2018 16:15    Procedures Procedures (including critical care time)  Medications Ordered in ED Medications  sodium chloride 0.9 % bolus 1,000 mL (0 mLs Intravenous Stopped 12/17/18 2036)  ibuprofen (ADVIL) tablet 400 mg  (400 mg Oral Given 12/17/18 1845)  ondansetron (ZOFRAN) injection 4 mg (4 mg Intravenous Given 12/17/18 1845)  iohexol (OMNIPAQUE) 350 MG/ML injection 100 mL (100 mLs Intravenous Contrast Given 12/17/18 2138)     Initial Impression / Assessment and Plan / ED Course  I have reviewed the triage vital signs and the nursing notes.  Pertinent labs & imaging results that were available during my care of the patient were reviewed by me and considered in my medical decision making (see chart for details).  23 year old female presents with headache, neck pain, syncope, SOB, abdominal pain.  Sounds like viral illness. She is negative for COVID x 2. Her vitals are normal although BP is soft. Will giver fluids, Zofran, and Ibuprofen. Will check labs, EKG. CXR done in triage is negative.  EKG is SR. CBC is remarkable for leukocytosis of 15 - likely reactive from steroids. CMP is remarkable for normal kidney function, slightly elevated ALT (55). Mono is negative. UA is not consistent with obvious UTI. Rechecked pt to discuss the results. She is reporting ongoing pain between her shoulder blades and SOB. Shared decision making regarding imaging. She is having chest tightness, ongoing SOB, and now has had a syncopal episode. She is on OCPs although there are no clinical signs of DVT. Although vital signs are reassuring with no hypoxia, normal HR and she's had a negative D-dimer, I think it's unlikely she has a PE but she is very worried and would like to proceed with advanced imaging. Will order CTA of chest.  At shift change, CT is pending. Care signed out to [facility].    Final Clinical Impressions(s) / ED Diagnoses   Final diagnoses:  Syncope, unspecified syncope type  Shortness of breath  Acute midline thoracic back pain    ED Discharge Orders    None       Iris Pert 12/17/18 2207    Nat Christen, MD 12/18/18 1352

## 2018-12-17 NOTE — ED Provider Notes (Signed)
Patient signed out to me from [facility].  Patient is a 23 year old with a one-week history of shortness of breath today accompanied by syncope while reclining at home, no chest pain but has had pressure and episodes of palpitations along with intermittent headache.  Currently on prednisone and albuterol for symptom relief.  Also taking Excedrin Migraine for her headaches.  She has had negative COVID testing, labs today are reassuring.  She had a CT angios ruling out PE and occult pneumonia.  She was in no acute distress prior to discharge home, no shortness of breath, vital signs are stable, no tachypnea or hypoxia.  She was encouraged to finish her prednisone taper, continue using her albuterol inhaler as needed.  Advised that Excedrin contains caffeine and could possibly be triggering her palpitations.  Patient advised to correlate this symptom in association with Excedrin consumption.  She will plan to follow-up with her PCP for recheck by the end of the week if symptoms are not improving.   Evalee Jefferson, PA-C 12/17/18 2336    Nat Christen, MD 12/18/18 [phone]

## 2019-05-07 DIAGNOSIS — R0981 Nasal congestion: Secondary | ICD-10-CM | POA: Diagnosis not present

## 2019-05-30 DIAGNOSIS — K8020 Calculus of gallbladder without cholecystitis without obstruction: Secondary | ICD-10-CM

## 2019-05-30 HISTORY — DX: Calculus of gallbladder without cholecystitis without obstruction: K80.20

## 2019-06-30 DIAGNOSIS — R112 Nausea with vomiting, unspecified: Secondary | ICD-10-CM | POA: Diagnosis not present

## 2019-06-30 DIAGNOSIS — R0602 Shortness of breath: Secondary | ICD-10-CM | POA: Diagnosis not present

## 2019-06-30 DIAGNOSIS — R0981 Nasal congestion: Secondary | ICD-10-CM | POA: Diagnosis not present

## 2019-06-30 DIAGNOSIS — R1010 Upper abdominal pain, unspecified: Secondary | ICD-10-CM | POA: Diagnosis not present

## 2019-06-30 DIAGNOSIS — R509 Fever, unspecified: Secondary | ICD-10-CM | POA: Diagnosis not present

## 2019-06-30 DIAGNOSIS — I498 Other specified cardiac arrhythmias: Secondary | ICD-10-CM | POA: Diagnosis not present

## 2019-07-02 ENCOUNTER — Other Ambulatory Visit (HOSPITAL_COMMUNITY): Payer: Self-pay | Admitting: Adult Health Nurse Practitioner

## 2019-07-02 ENCOUNTER — Other Ambulatory Visit: Payer: Self-pay | Admitting: Adult Health Nurse Practitioner

## 2019-07-02 DIAGNOSIS — R1010 Upper abdominal pain, unspecified: Secondary | ICD-10-CM

## 2019-07-09 ENCOUNTER — Ambulatory Visit (HOSPITAL_COMMUNITY): Payer: BC Managed Care – PPO

## 2019-07-09 ENCOUNTER — Encounter (HOSPITAL_COMMUNITY): Payer: Self-pay

## 2019-07-10 ENCOUNTER — Other Ambulatory Visit (HOSPITAL_COMMUNITY): Payer: Self-pay | Admitting: Adult Health Nurse Practitioner

## 2019-07-18 ENCOUNTER — Encounter (HOSPITAL_COMMUNITY): Payer: Self-pay

## 2019-07-18 ENCOUNTER — Ambulatory Visit (HOSPITAL_COMMUNITY): Admission: RE | Admit: 2019-07-18 | Payer: BC Managed Care – PPO | Source: Ambulatory Visit

## 2019-07-25 ENCOUNTER — Other Ambulatory Visit: Payer: Self-pay

## 2019-07-25 ENCOUNTER — Ambulatory Visit (HOSPITAL_COMMUNITY)
Admission: RE | Admit: 2019-07-25 | Discharge: 2019-07-25 | Disposition: A | Discharge status: Home / Self Care | Payer: BC Managed Care – PPO | Source: Ambulatory Visit | Attending: Adult Health Nurse Practitioner | Admitting: Adult Health Nurse Practitioner

## 2019-07-25 DIAGNOSIS — R1010 Upper abdominal pain, unspecified: Secondary | ICD-10-CM | POA: Diagnosis not present

## 2019-07-25 DIAGNOSIS — K8020 Calculus of gallbladder without cholecystitis without obstruction: Secondary | ICD-10-CM | POA: Diagnosis not present

## 2019-08-05 ENCOUNTER — Other Ambulatory Visit (HOSPITAL_COMMUNITY): Payer: Self-pay | Admitting: Internal Medicine

## 2019-08-05 DIAGNOSIS — R109 Unspecified abdominal pain: Secondary | ICD-10-CM

## 2019-08-05 DIAGNOSIS — R110 Nausea: Secondary | ICD-10-CM

## 2019-08-07 ENCOUNTER — Other Ambulatory Visit: Payer: Self-pay

## 2019-08-07 ENCOUNTER — Emergency Department (HOSPITAL_COMMUNITY)
Admission: EM | Admit: 2019-08-07 | Discharge: 2019-08-07 | Disposition: A | Discharge status: Home / Self Care | Payer: BC Managed Care – PPO | Attending: Emergency Medicine | Admitting: Emergency Medicine

## 2019-08-07 ENCOUNTER — Encounter (HOSPITAL_COMMUNITY): Payer: Self-pay

## 2019-08-07 DIAGNOSIS — R3915 Urgency of urination: Secondary | ICD-10-CM | POA: Diagnosis not present

## 2019-08-07 DIAGNOSIS — R110 Nausea: Secondary | ICD-10-CM | POA: Insufficient documentation

## 2019-08-07 DIAGNOSIS — R1011 Right upper quadrant pain: Secondary | ICD-10-CM | POA: Diagnosis not present

## 2019-08-07 DIAGNOSIS — K8020 Calculus of gallbladder without cholecystitis without obstruction: Secondary | ICD-10-CM | POA: Diagnosis not present

## 2019-08-07 DIAGNOSIS — Z79899 Other long term (current) drug therapy: Secondary | ICD-10-CM | POA: Diagnosis not present

## 2019-08-07 HISTORY — DX: Endometriosis, unspecified: N80.9

## 2019-08-07 LAB — URINALYSIS, ROUTINE W REFLEX MICROSCOPIC
Bilirubin Urine: NEGATIVE
Glucose, UA: NEGATIVE mg/dL
Hgb urine dipstick: NEGATIVE
Ketones, ur: NEGATIVE mg/dL
Leukocytes,Ua: NEGATIVE
Nitrite: NEGATIVE
Protein, ur: NEGATIVE mg/dL
Specific Gravity, Urine: 1.008 (ref 1.005–1.030)
pH: 8 (ref 5.0–8.0)

## 2019-08-07 LAB — CBC WITH DIFFERENTIAL/PLATELET
Abs Immature Granulocytes: 0.02 10*3/uL (ref 0.00–0.07)
Basophils Absolute: 0 10*3/uL (ref 0.0–0.1)
Basophils Relative: 0 %
Eosinophils Absolute: 0.2 10*3/uL (ref 0.0–0.5)
Eosinophils Relative: 3 %
HCT: 39.6 % (ref 36.0–46.0)
Hemoglobin: 12.5 g/dL (ref 12.0–15.0)
Immature Granulocytes: 0 %
Lymphocytes Relative: 20 %
Lymphs Abs: 1.7 10*3/uL (ref 0.7–4.0)
MCH: 25.3 pg — ABNORMAL LOW (ref 26.0–34.0)
MCHC: 31.6 g/dL (ref 30.0–36.0)
MCV: 80 fL (ref 80.0–100.0)
Monocytes Absolute: 0.5 10*3/uL (ref 0.1–1.0)
Monocytes Relative: 6 %
Neutro Abs: 6.3 10*3/uL (ref 1.7–7.7)
Neutrophils Relative %: 71 %
Platelets: 285 10*3/uL (ref 150–400)
RBC: 4.95 MIL/uL (ref 3.87–5.11)
RDW: 14.3 % (ref 11.5–15.5)
WBC: 8.8 10*3/uL (ref 4.0–10.5)
nRBC: 0 % (ref 0.0–0.2)

## 2019-08-07 LAB — COMPREHENSIVE METABOLIC PANEL
ALT: 17 U/L (ref 0–44)
AST: 16 U/L (ref 15–41)
Albumin: 4 g/dL (ref 3.5–5.0)
Alkaline Phosphatase: 57 U/L (ref 38–126)
Anion gap: 8 (ref 5–15)
BUN: 11 mg/dL (ref 6–20)
CO2: 24 mmol/L (ref 22–32)
Calcium: 9 mg/dL (ref 8.9–10.3)
Chloride: 104 mmol/L (ref 98–111)
Creatinine, Ser: 0.66 mg/dL (ref 0.44–1.00)
GFR calc Af Amer: >60 mL/min (ref >60)
GFR calc non Af Amer: >60 mL/min (ref >60)
Glucose, Bld: 111 mg/dL — ABNORMAL HIGH (ref 70–99)
Potassium: 3.8 mmol/L (ref 3.5–5.1)
Sodium: 136 mmol/L (ref 135–145)
Total Bilirubin: 0.4 mg/dL (ref 0.3–1.2)
Total Protein: 8.1 g/dL (ref 6.5–8.1)

## 2019-08-07 LAB — POC URINE PREG, ED: Preg Test, Ur: NEGATIVE

## 2019-08-07 LAB — LIPASE, BLOOD: Lipase: 29 U/L (ref 11–51)

## 2019-08-07 MED ORDER — ONDANSETRON HCL 4 MG/2ML IJ SOLN
4.0000 mg | Freq: Once | INTRAMUSCULAR | Status: AC
  Administered 2019-08-07: 4 mg via INTRAVENOUS
  Filled 2019-08-07: qty 2

## 2019-08-07 MED ORDER — FENTANYL CITRATE (PF) 100 MCG/2ML IJ SOLN
50.0000 ug | Freq: Once | INTRAMUSCULAR | Status: AC
  Administered 2019-08-07: 50 ug via INTRAVENOUS
  Filled 2019-08-07: qty 2

## 2019-08-07 MED ORDER — HYDROCODONE-ACETAMINOPHEN 5-325 MG PO TABS: 1.0000 | ORAL_TABLET | ORAL | 0 refills | Status: DC | PRN

## 2019-08-07 NOTE — ED Provider Notes (Signed)
[facility] Provider Note   CSN: [id number] Arrival date & time: 08/07/19  [id number]     History Chief Complaint  Patient presents with  . Abdominal Pain    Joy Heath is a 24 y.o. female with a past medical history of PCOS, pots syndrome and iron deficiency anemia presenting with and approximate 3-week history of intermittent right upper quadrant abdominal pain.  She underwent ultrasound imaging on 2/26 revealing for gallstones without acute cholecystitis, plan next step including a HIDA scan to determine the need for surgical intervention.  She presents today with escalating pain which is been constant since yesterday afternoon.  She describes sharp right upper quadrant pain that radiates into her right shoulder in association with nausea without emesis.  She denies fevers or chills.  She does endorse generalized abdominal discomfort but states this is more of a chronic problem and not worse today.  She has noticed urinary urgency since yesterday, denies dysuria or hematuria.  She tried to eat breakfast this morning which worsened her nausea.  She has had no vomiting.  She has had no treatments prior to arrival and has found no alleviators.  The history is provided by the patient.       Past Medical History:  Diagnosis Date  . Endometriosis   . Iron deficiency anemia   . PCOS (polycystic ovarian syndrome)   . POTS (postural orthostatic tachycardia syndrome)     There are no problems to display for this patient.   Past Surgical History:  Procedure Laterality Date  . TONSILLECTOMY       OB History   No obstetric history on file.     No family history on file.  Social History   Tobacco Use  . Smoking status: Never Smoker  . Smokeless tobacco: Never Used  Substance Use Topics  . Alcohol use: No  . Drug use: No    Home Medications Prior to Admission medications   Medication Sig Start Date End Date Taking? Authorizing Provider  albuterol  (VENTOLIN HFA) 108 (90 Base) MCG/ACT inhaler Inhale 1-2 puffs into the lungs every 6 (six) hours as needed for wheezing or shortness of breath.   Yes [provider]  cetirizine-pseudoephedrine (ZYRTEC-D) 5-120 MG tablet Take 1 tablet by mouth daily as needed for allergies.   Yes [provider]  famotidine (PEPCID) 20 MG tablet Take 20 mg by mouth 2 (two) times daily as needed for heartburn or indigestion.   Yes [provider]  ibuprofen (ADVIL) 800 MG tablet Take 800 mg by mouth every 8 (eight) hours as needed for headache or mild pain.   Yes [provider]  ondansetron (ZOFRAN ODT) 4 MG disintegrating tablet Take 1 tablet (4 mg total) by mouth every 8 (eight) hours as needed for nausea or vomiting. 12/17/18  Yes Anderson Coppock, Almyra Free, PA-C  pantoprazole (PROTONIX) 40 MG tablet Take 40 mg by mouth daily.   Yes [provider]  HYDROcodone-acetaminophen (NORCO/VICODIN) 5-325 MG tablet Take 1 tablet by mouth every 4 (four) hours as needed. 08/07/19   Evalee Jefferson, PA-C  Norgestimate-Ethinyl Estradiol Triphasic (TRI-LO-SPRINTEC) 0.18/0.215/0.25 MG-25 MCG tab Take 1 tablet by mouth daily.    [provider]  predniSONE (DELTASONE) 10 MG tablet Take 10-60 mg by mouth See admin instructions. 60MG  ON DAY 1, THEN 50MG  ON DAY 2, THEN 40MG  ON DAY 3, THEN 30MG  ON DAY 4 THEN 20MG  ON DAY 5, THEN 10MG  ON DAY 6 THEN STOP. TO COMPLETE COURSE ON 12/18/2018  12/12/18   [provider]    Allergies    Patient has no known allergies.  Review of Systems   Review of Systems  Constitutional: Negative for chills and fever.  HENT: Negative for congestion and sore throat.   Eyes: Negative.   Respiratory: Negative for chest tightness and shortness of breath.   Cardiovascular: Negative for chest pain.  Gastrointestinal: Positive for abdominal pain and nausea.  Genitourinary: Positive for urgency.  Musculoskeletal: Negative for arthralgias, joint swelling and neck pain.    Skin: Negative.  Negative for rash and wound.  Neurological: Negative for dizziness, weakness, light-headedness, numbness and headaches.  Psychiatric/Behavioral: Negative.     Physical Exam Updated Vital Signs BP (!) 106/56 (BP Location: Right Arm)   Pulse 64   Temp 99.2 F (37.3 C) (Oral)   Resp 17   Ht 5\' 5"  (1.651 m)   Wt 116.1 kg   LMP 07/12/2019 (Approximate)   SpO2 100%   BMI 42.60 kg/m   Physical Exam Vitals and nursing note reviewed.  Constitutional:      Appearance: She is well-developed.  HENT:     Head: Normocephalic and atraumatic.  Eyes:     Conjunctiva/sclera: Conjunctivae normal.  Cardiovascular:     Rate and Rhythm: Normal rate and regular rhythm.     Heart sounds: Normal heart sounds.  Pulmonary:     Effort: Pulmonary effort is normal.     Breath sounds: Normal breath sounds. No wheezing.  Abdominal:     General: Abdomen is protuberant. Bowel sounds are normal.     Palpations: Abdomen is soft.     Tenderness: There is abdominal tenderness in the right upper quadrant and left lower quadrant. Positive signs include Murphy's sign.     Comments: Pt states LLQ pain chronic with her PCOS  Musculoskeletal:        General: Normal range of motion.     Cervical back: Normal range of motion.  Skin:    General: Skin is warm and dry.  Neurological:     Mental Status: She is alert.     ED Results / Procedures / Treatments   Labs (all labs ordered are listed, but only abnormal results are displayed) Labs Reviewed  CBC WITH DIFFERENTIAL/PLATELET - Abnormal; Notable for the following components:      Result Value   MCH 25.3 (*)    All other components within normal limits  COMPREHENSIVE METABOLIC PANEL - Abnormal; Notable for the following components:   Glucose, Bld 111 (*)    All other components within normal limits  URINALYSIS, ROUTINE W REFLEX MICROSCOPIC - Abnormal; Notable for the following components:   Color, Urine STRAW (*)    All other  components within normal limits  LIPASE, BLOOD  POC URINE PREG, ED    EKG None  Radiology No results found.  Procedures Procedures (including critical care time)  Medications Ordered in ED Medications  ondansetron (ZOFRAN) injection 4 mg (4 mg Intravenous Given 08/07/19 1011)  fentaNYL (SUBLIMAZE) injection 50 mcg (50 mcg Intravenous Given 08/07/19 1012)    ED Course  I have reviewed the triage vital signs and the nursing notes.  Pertinent labs & imaging results that were available during my care of the patient were reviewed by me and considered in my medical decision making (see chart for details).    MDM Rules/Calculators/A&P                      Patient with  return of her right upper quadrant pain with known gallstones, currently pending HIDA scan.  Her lab's were reviewed and normal today with a normal white blood cell count, also patient has normal LFTs and lipase.  She was give Zofran and a small dose of fentanyl and had complete resolution of her pain.  No further symptoms while in the department.  She was given a p.o. challenge which she tolerated well.  She does have Phenergan at home, I added hydrocodone for pain relief if she has another episode of pain.  She was given strict return precautions including uncontrolled pain, vomiting or development of fevers or weakness.  She has a HIDA scan scheduled here in 5 days. Final Clinical Impression(s) / ED Diagnoses Final diagnoses:  RUQ pain  Calculus of gallbladder without cholecystitis without obstruction    Rx / DC Orders ED Discharge Orders         Ordered    HYDROcodone-acetaminophen (NORCO/VICODIN) 5-325 MG tablet  Every 4 hours PRN     08/07/19 1535           Landis Martins 08/07/19 1633    Truddie Hidden, MD 08/08/19 [phone]

## 2019-08-07 NOTE — ED Triage Notes (Signed)
Patient presents to the ED since last night with history of gallbladder attacks. Patient still has gall bladder with an appointment next week for a HIDA scan.  Patient has increased pain last night and PCP recommend she to be seen in the ED today.

## 2019-08-07 NOTE — Discharge Instructions (Addendum)
Keep your appointment for the HIDA scan on Tuesday as scheduled by your primary doctor.  Remember to avoid eating fatty foods as this food is most likely to cause worsened pain as discussed.  Continue using your Phenergan as needed.  You have been prescribed hydrocodone for you to use if you do have an episode of pain.  Reasons to get rechecked urgently include uncontrolled vomiting, uncontrolled pain or if you develop fevers.  Your lab tests today are normal.  Do not drive within 4 hours of taking hydrocodone as this medication will make you drowsy.

## 2019-08-12 ENCOUNTER — Ambulatory Visit (HOSPITAL_COMMUNITY)
Admission: RE | Admit: 2019-08-12 | Discharge: 2019-08-12 | Disposition: A | Discharge status: Home / Self Care | Payer: BC Managed Care – PPO | Source: Ambulatory Visit | Attending: Internal Medicine | Admitting: Internal Medicine

## 2019-08-12 ENCOUNTER — Other Ambulatory Visit: Payer: Self-pay

## 2019-08-12 DIAGNOSIS — R112 Nausea with vomiting, unspecified: Secondary | ICD-10-CM | POA: Diagnosis not present

## 2019-08-12 DIAGNOSIS — R109 Unspecified abdominal pain: Secondary | ICD-10-CM | POA: Diagnosis not present

## 2019-08-12 DIAGNOSIS — R110 Nausea: Secondary | ICD-10-CM | POA: Diagnosis not present

## 2019-08-12 MED ORDER — TECHNETIUM TC 99M MEBROFENIN IV KIT
5.0000 | PACK | Freq: Once | INTRAVENOUS | Status: AC | PRN
  Administered 2019-08-12: 5.3 via INTRAVENOUS

## 2019-08-22 ENCOUNTER — Encounter: Payer: Self-pay | Admitting: Gastroenterology

## 2019-09-07 ENCOUNTER — Other Ambulatory Visit: Payer: Self-pay

## 2019-09-07 ENCOUNTER — Emergency Department (HOSPITAL_COMMUNITY)
Admission: EM | Admit: 2019-09-07 | Discharge: 2019-09-07 | Disposition: A | Discharge status: Home / Self Care | Payer: BC Managed Care – PPO | Attending: Emergency Medicine | Admitting: Emergency Medicine

## 2019-09-07 ENCOUNTER — Encounter (HOSPITAL_COMMUNITY): Payer: Self-pay | Admitting: Emergency Medicine

## 2019-09-07 ENCOUNTER — Emergency Department (HOSPITAL_COMMUNITY): Payer: BC Managed Care – PPO

## 2019-09-07 DIAGNOSIS — R197 Diarrhea, unspecified: Secondary | ICD-10-CM | POA: Insufficient documentation

## 2019-09-07 DIAGNOSIS — Z79899 Other long term (current) drug therapy: Secondary | ICD-10-CM | POA: Insufficient documentation

## 2019-09-07 DIAGNOSIS — R112 Nausea with vomiting, unspecified: Secondary | ICD-10-CM | POA: Diagnosis not present

## 2019-09-07 DIAGNOSIS — K8020 Calculus of gallbladder without cholecystitis without obstruction: Secondary | ICD-10-CM | POA: Insufficient documentation

## 2019-09-07 DIAGNOSIS — R109 Unspecified abdominal pain: Secondary | ICD-10-CM

## 2019-09-07 DIAGNOSIS — R1011 Right upper quadrant pain: Secondary | ICD-10-CM | POA: Diagnosis not present

## 2019-09-07 LAB — COMPREHENSIVE METABOLIC PANEL
ALT: 19 U/L (ref 0–44)
AST: 16 U/L (ref 15–41)
Albumin: 4.2 g/dL (ref 3.5–5.0)
Alkaline Phosphatase: 61 U/L (ref 38–126)
Anion gap: 11 (ref 5–15)
BUN: 10 mg/dL (ref 6–20)
CO2: 19 mmol/L — ABNORMAL LOW (ref 22–32)
Calcium: 9.2 mg/dL (ref 8.9–10.3)
Chloride: 108 mmol/L (ref 98–111)
Creatinine, Ser: 0.71 mg/dL (ref 0.44–1.00)
GFR calc Af Amer: >60 mL/min (ref >60)
GFR calc non Af Amer: >60 mL/min (ref >60)
Glucose, Bld: 89 mg/dL (ref 70–99)
Potassium: 4.1 mmol/L (ref 3.5–5.1)
Sodium: 138 mmol/L (ref 135–145)
Total Bilirubin: 0.3 mg/dL (ref 0.3–1.2)
Total Protein: 7.9 g/dL (ref 6.5–8.1)

## 2019-09-07 LAB — LIPASE, BLOOD: Lipase: 34 U/L (ref 11–51)

## 2019-09-07 LAB — URINALYSIS, ROUTINE W REFLEX MICROSCOPIC
Bilirubin Urine: NEGATIVE
Glucose, UA: NEGATIVE mg/dL
Hgb urine dipstick: NEGATIVE
Ketones, ur: NEGATIVE mg/dL
Leukocytes,Ua: NEGATIVE
Nitrite: NEGATIVE
Protein, ur: NEGATIVE mg/dL
Specific Gravity, Urine: 1.005 (ref 1.005–1.030)
pH: 7 (ref 5.0–8.0)

## 2019-09-07 LAB — CBC
HCT: 41.4 % (ref 36.0–46.0)
Hemoglobin: 13.2 g/dL (ref 12.0–15.0)
MCH: 25 pg — ABNORMAL LOW (ref 26.0–34.0)
MCHC: 31.9 g/dL (ref 30.0–36.0)
MCV: 78.6 fL — ABNORMAL LOW (ref 80.0–100.0)
Platelets: 327 10*3/uL (ref 150–400)
RBC: 5.27 MIL/uL — ABNORMAL HIGH (ref 3.87–5.11)
RDW: 14.5 % (ref 11.5–15.5)
WBC: 10.1 10*3/uL (ref 4.0–10.5)
nRBC: 0 % (ref 0.0–0.2)

## 2019-09-07 LAB — I-STAT BETA HCG BLOOD, ED (MC, WL, AP ONLY): I-stat hCG, quantitative: <5 m[IU]/mL (ref <5)

## 2019-09-07 MED ORDER — ONDANSETRON 4 MG PO TBDP: 4.0000 mg | ORAL_TABLET | Freq: Three times a day (TID) | ORAL | 0 refills | Status: DC | PRN

## 2019-09-07 MED ORDER — HYDROMORPHONE HCL 1 MG/ML IJ SOLN
0.5000 mg | Freq: Once | INTRAMUSCULAR | Status: AC
  Administered 2019-09-07: 0.5 mg via INTRAVENOUS
  Filled 2019-09-07: qty 1

## 2019-09-07 MED ORDER — HYDROCODONE-ACETAMINOPHEN 5-325 MG PO TABS: 1.0000 | ORAL_TABLET | Freq: Four times a day (QID) | ORAL | 0 refills | Status: DC | PRN

## 2019-09-07 MED ORDER — ONDANSETRON 4 MG PO TBDP
4.0000 mg | ORAL_TABLET | Freq: Once | ORAL | Status: AC | PRN
  Administered 2019-09-07: 4 mg via ORAL
  Filled 2019-09-07: qty 1

## 2019-09-07 MED ORDER — SODIUM CHLORIDE 0.9% FLUSH
3.0000 mL | Freq: Once | INTRAVENOUS | Status: AC
  Administered 2019-09-07: 3 mL via INTRAVENOUS

## 2019-09-07 MED ORDER — SODIUM CHLORIDE 0.9 % IV BOLUS
1000.0000 mL | Freq: Once | INTRAVENOUS | Status: AC
  Administered 2019-09-07: 1000 mL via INTRAVENOUS

## 2019-09-07 MED ORDER — FENTANYL CITRATE (PF) 100 MCG/2ML IJ SOLN
100.0000 ug | Freq: Once | INTRAMUSCULAR | Status: AC
  Administered 2019-09-07: 100 ug via INTRAVENOUS
  Filled 2019-09-07: qty 2

## 2019-09-07 MED ORDER — ONDANSETRON HCL 4 MG/2ML IJ SOLN
4.0000 mg | Freq: Once | INTRAMUSCULAR | Status: AC
  Administered 2019-09-07: 4 mg via INTRAVENOUS
  Filled 2019-09-07: qty 2

## 2019-09-07 NOTE — ED Triage Notes (Signed)
Onset 05/2019 developed right abdominal pain seen primary doctor and had ultras sound and HIDA scan. States recently pain increased with nausea and emesis.

## 2019-09-07 NOTE — ED Notes (Signed)
Patient transported to Ultrasound 

## 2019-09-07 NOTE — ED Notes (Signed)
Pt provided PO fluid for PO Challenge, pt did not tolerate, became nauseous and spitting up. MD made aware.

## 2019-09-07 NOTE — Discharge Instructions (Signed)
Your Ultrasound shows evidence of gallstones but no signs of infection.   You can take pain medication for severe or breakthrough pain.   Take Zofran for nausea/vomiting.  Follow-up with referred surgeon for further evaluation.   Return emergency department for any worsening pain, persistent vomiting that is not able to be stopped, fever or any other worsening or concerning symptoms.

## 2019-09-07 NOTE — ED Notes (Signed)
Pt provided water for PO challenge, pt drank about half before becoming nauseous again and spitting water back up.

## 2019-09-07 NOTE — ED Provider Notes (Signed)
[facility] Provider Note   CSN: [id number] Arrival date & time: 09/07/19  1401     History Chief Complaint  Patient presents with  . Abdominal Pain  . Nausea  . Emesis    Joy Heath is a 24 y.o. female with PMH/o Endometriosis, PCOS who presents for evaluation of right upper quadrant abdominal pain that worsened last night.  Associated with nausea/vomiting and diarrhea.  Patient states that she started having some abdominal pain in January 2021.  She saw her PCP who ordered her an outpatient right upper quadrant ultrasound that showed evidence of stones.  She was sent to have a HIDA scan which was normal.  She has been trying to manage her symptoms with diet changes, limiting spicy, greasy foods.  She reports that she will have intermittent episodes of pain but they will eventually resolve.  She reports that yesterday, her pain started getting worse.  She had had some slight abdominal pressure over the last week but felt like last night is when the pain intensified.  She states since then it has gotten progressively worse.  She described as a sharp, cramping pain in her right upper quadrant abdomen that radiates to her epigastric region, to her right shoulder and to her back.  She states she is taken ibuprofen with no improvement in symptoms.  She reports he has had nausea and then today started having some emesis.  She does report that some of her emesis was green in nature.  No blood noted in emesis.  No blood noted in stools.  She has not noted any fevers.  She denies any chest pain, difficulty breathing, dysuria, hematuria.  She has not seen a surgeon for evaluation.   The history is provided by the patient.       Past Medical History:  Diagnosis Date  . Endometriosis   . Iron deficiency anemia   . PCOS (polycystic ovarian syndrome)   . POTS (postural orthostatic tachycardia syndrome)     There are no problems to display for this  patient.   Past Surgical History:  Procedure Laterality Date  . TONSILLECTOMY       OB History   No obstetric history on file.     No family history on file.  Social History   Tobacco Use  . Smoking status: Never Smoker  . Smokeless tobacco: Never Used  Substance Use Topics  . Alcohol use: No  . Drug use: No    Home Medications Prior to Admission medications   Medication Sig Start Date End Date Taking? Authorizing Provider  albuterol (VENTOLIN HFA) 108 (90 Base) MCG/ACT inhaler Inhale 1-2 puffs into the lungs every 6 (six) hours as needed for wheezing or shortness of breath.   Yes [provider]  cetirizine-pseudoephedrine (ZYRTEC-D) 5-120 MG tablet Take 1 tablet by mouth daily as needed for allergies.   Yes [provider]  pantoprazole (PROTONIX) 40 MG tablet Take 40 mg by mouth daily.   Yes [provider]  HYDROcodone-acetaminophen (NORCO/VICODIN) 5-325 MG tablet Take 1-2 tablets by mouth every 6 (six) hours as needed. 09/07/19   Volanda Napoleon, PA-C  ondansetron (ZOFRAN ODT) 4 MG disintegrating tablet Take 1 tablet (4 mg total) by mouth every 8 (eight) hours as needed for nausea or vomiting. 09/07/19   Volanda Napoleon, PA-C    Allergies    Patient has no known allergies.  Review of Systems   Review of Systems  Constitutional: Negative for  fever.  Respiratory: Negative for cough and shortness of breath.   Cardiovascular: Negative for chest pain.  Gastrointestinal: Positive for abdominal pain, nausea and vomiting.  Genitourinary: Negative for dysuria and hematuria.  Neurological: Negative for headaches.  All other systems reviewed and are negative.   Physical Exam Updated Vital Signs BP 100/63 (BP Location: Right Arm)   Pulse 67   Temp 99.8 F (37.7 C) (Oral)   Resp 16   Ht 5\' 5"  (1.651 m)   Wt 127 kg   SpO2 100%   BMI 46.59 kg/m   Physical Exam Vitals and nursing note reviewed.  Constitutional:      Appearance: Normal  appearance. She is well-developed.     Comments: Appears uncomfortable but no acute distress   HENT:     Head: Normocephalic and atraumatic.  Eyes:     General: Lids are normal.     Conjunctiva/sclera: Conjunctivae normal.     Pupils: Pupils are equal, round, and reactive to light.  Cardiovascular:     Rate and Rhythm: Normal rate and regular rhythm.     Pulses: Normal pulses.     Heart sounds: Normal heart sounds. No murmur. No friction rub. No gallop.   Pulmonary:     Effort: Pulmonary effort is normal.     Breath sounds: Normal breath sounds.     Comments: Lungs clear to auscultation bilaterally.  Symmetric chest rise.  No wheezing, rales, rhonchi. Abdominal:     Palpations: Abdomen is soft. Abdomen is not rigid.     Tenderness: There is abdominal tenderness in the right upper quadrant. There is no guarding. Positive signs include Murphy's sign.     Comments: Abdomen soft, nondistended.  Tenderness noted diffusely with focal tenderness in the right upper quadrant.  Positive Murphy signs.  No CVA tenderness noted bilaterally.  Musculoskeletal:        General: Normal range of motion.     Cervical back: Full passive range of motion without pain.  Skin:    General: Skin is warm and dry.     Capillary Refill: Capillary refill takes less than 2 seconds.  Neurological:     Mental Status: She is alert and oriented to person, place, and time.  Psychiatric:        Speech: Speech normal.     ED Results / Procedures / Treatments   Labs (all labs ordered are listed, but only abnormal results are displayed) Labs Reviewed  COMPREHENSIVE METABOLIC PANEL - Abnormal; Notable for the following components:      Result Value   CO2 19 (*)    All other components within normal limits  CBC - Abnormal; Notable for the following components:   RBC 5.27 (*)    MCV 78.6 (*)    MCH 25.0 (*)    All other components within normal limits  URINALYSIS, ROUTINE W REFLEX MICROSCOPIC - Abnormal; Notable  for the following components:   Color, Urine STRAW (*)    All other components within normal limits  LIPASE, BLOOD  I-STAT BETA HCG BLOOD, ED ([facility], [facility], [facility] ONLY)    EKG None  Radiology US Abdomen Limited RUQ  Result Date: 09/07/2019 CLINICAL DATA:  Abdominal pain. EXAM: ULTRASOUND ABDOMEN LIMITED RIGHT UPPER QUADRANT COMPARISON:  Abdominal CT 10/10/2015. Abdominal ultrasound 07/25/2019 FINDINGS: Gallbladder: Mobile gallstone measuring 11 mm again noted. No gallbladder wall thickening. Assessment for sonographic Percell Miller sign limited by patient medication. Common bile duct: Diameter: 4 mm Liver: No focal lesion identified. Within normal  limits in parenchymal echogenicity. Portal vein is patent on color Doppler imaging with normal direction of blood flow towards the liver. Other: None. IMPRESSION: Cholelithiasis without sonographic evidence of cholecystitis or biliary dilatation. Electronically Signed   By: Richardean Sale M.D.   On: 09/07/2019 16:49    Procedures Procedures (including critical care time)  Medications Ordered in ED Medications  sodium chloride flush (NS) 0.9 % injection 3 mL (3 mLs Intravenous Given 09/07/19 1451)  ondansetron (ZOFRAN-ODT) disintegrating tablet 4 mg (4 mg Oral Given 09/07/19 1419)  fentaNYL (SUBLIMAZE) injection 100 mcg (100 mcg Intravenous Given 09/07/19 1522)  ondansetron (ZOFRAN) injection 4 mg (4 mg Intravenous Given 09/07/19 1521)  sodium chloride 0.9 % bolus 1,000 mL (0 mLs Intravenous Stopped 09/07/19 1707)  sodium chloride 0.9 % bolus 1,000 mL (0 mLs Intravenous Stopped 09/07/19 1753)  HYDROmorphone (DILAUDID) injection 0.5 mg (0.5 mg Intravenous Given 09/07/19 1706)  ondansetron (ZOFRAN) injection 4 mg (4 mg Intravenous Given 09/07/19 1754)    ED Course  I have reviewed the triage vital signs and the nursing notes.  Pertinent labs & imaging results that were available during my care of the patient were reviewed by me and considered in my medical decision  making (see chart for details).    MDM Rules/Calculators/A&P                      24 year old female who presents for evaluation of right upper quadrant abdominal pain, nausea/vomiting, diarrhea.  Reports she has had been having issues with her upper abdomen for a while.  Had a ultrasound that revealed gallstones and normal HIDA scan.  Has not seen a surgeon yet.  Comes in today because symptoms are worse. Patient is afebrile, non-toxic appearing, sitting comfortably on examination table. Vital signs reviewed and stable.  On exam, tenderness palpation of the right upper quadrant positive Murphy sign.  Consider hepatobiliary of the versus infectious etiology.  Labs ordered at triage.  Plan for right upper quadrant ultrasound.  I-STAT beta negative.  CMP shows bicarb of 19.  BUN and creatinine within normal limits.  Normal LFTs.  CBC without any significant leukocytosis or anemia.  Lipase is unremarkable.  UA negative for infection etiology.  U/S shows cholelithiasis without evidence of cholecystitis or biliary dilatation.   P.o. challenge patient.  She did have 1 more episode of vomiting after p.o.  Will give additional Zofran and reassess.  She states her pain is now 2/10 but she still feeling nauseous.  Repeat p.o. challenge.  Patient was able to drink about half.  She states she feels slightly nauseous.  Nurse reported that she spit up did not have any true vomiting.  On my evaluation, she is resting comfortably on bed without any signs of distress.  Patient is tearful and concerned about going home and having recurrence of pain.  Discussed patient with Dr. Dema Severin (Gen Surg).  Patient is afebrile without any signs of leukocytosis and normal ultrasound.  No negation for emergent cholecystectomy.  Feels that patient can have symptom control and follow-up on an outpatient basis.  I discussed this plan with mom and patient.  Will give short course of pain medication and antiemetics for symptom  control.  Patient instructed follow-up with [facility] as directed.  She already has an appointment with GI in May.  Portions of this note were generated with [employer]. Dictation errors may occur despite best attempts at proofreading.  Final Clinical Impression(s) / ED Diagnoses Final  diagnoses:  Abdominal pain  Non-intractable vomiting with nausea, unspecified vomiting type    Rx / DC Orders ED Discharge Orders         Ordered    HYDROcodone-acetaminophen (NORCO/VICODIN) 5-325 MG tablet  Every 6 hours PRN     09/07/19 2008    ondansetron (ZOFRAN ODT) 4 MG disintegrating tablet  Every 8 hours PRN     09/07/19 2008           Desma Mcgregor 09/07/19 2223    Lajean Saver, MD 09/07/19 2315

## 2019-09-07 NOTE — ED Notes (Signed)
Pt transported to US

## 2019-09-17 DIAGNOSIS — R1010 Upper abdominal pain, unspecified: Secondary | ICD-10-CM | POA: Diagnosis not present

## 2019-09-29 DIAGNOSIS — K8020 Calculus of gallbladder without cholecystitis without obstruction: Secondary | ICD-10-CM | POA: Diagnosis not present

## 2019-09-30 ENCOUNTER — Other Ambulatory Visit (INDEPENDENT_AMBULATORY_CARE_PROVIDER_SITE_OTHER): Payer: BC Managed Care – PPO

## 2019-09-30 ENCOUNTER — Encounter: Payer: Self-pay | Admitting: Gastroenterology

## 2019-09-30 ENCOUNTER — Ambulatory Visit: Payer: BC Managed Care – PPO | Admitting: Gastroenterology

## 2019-09-30 VITALS — BP 96/72 | HR 79 | Temp 98.2°F | Ht 65.0 in | Wt 284.0 lb

## 2019-09-30 DIAGNOSIS — K625 Hemorrhage of anus and rectum: Secondary | ICD-10-CM

## 2019-09-30 DIAGNOSIS — Z862 Personal history of diseases of the blood and blood-forming organs and certain disorders involving the immune mechanism: Secondary | ICD-10-CM

## 2019-09-30 DIAGNOSIS — R110 Nausea: Secondary | ICD-10-CM

## 2019-09-30 DIAGNOSIS — K8020 Calculus of gallbladder without cholecystitis without obstruction: Secondary | ICD-10-CM | POA: Diagnosis not present

## 2019-09-30 DIAGNOSIS — R1011 Right upper quadrant pain: Secondary | ICD-10-CM

## 2019-09-30 DIAGNOSIS — K5909 Other constipation: Secondary | ICD-10-CM

## 2019-09-30 DIAGNOSIS — K8080 Other cholelithiasis without obstruction: Secondary | ICD-10-CM

## 2019-09-30 DIAGNOSIS — K219 Gastro-esophageal reflux disease without esophagitis: Secondary | ICD-10-CM

## 2019-09-30 LAB — IBC + FERRITIN
Ferritin: 13.8 ng/mL (ref 10.0–291.0)
Iron: 33 ug/dL — ABNORMAL LOW (ref 42–145)
Saturation Ratios: 7.7 % — ABNORMAL LOW (ref 20.0–50.0)
Transferrin: 308 mg/dL (ref 212.0–360.0)

## 2019-09-30 LAB — TSH: TSH: 3.7 u[IU]/mL (ref 0.35–4.50)

## 2019-09-30 LAB — IGA: IgA: 256 mg/dL (ref 68–378)

## 2019-09-30 LAB — CORTISOL: Cortisol, Plasma: 10.5 ug/dL

## 2019-09-30 MED ORDER — SUPREP BOWEL PREP KIT 17.5-3.13-1.6 GM/177ML PO SOLN: 1.0000 | ORAL | 0 refills | Status: DC

## 2019-09-30 NOTE — Patient Instructions (Addendum)
We have sent the following medications to your pharmacy for you to pick up at your convenience: [facility] provider has requested that you go to the basement level for lab work before leaving today. Press "B" on the elevator. The lab is located at the first door on the left as you exit the elevator.  You have been scheduled for an endoscopy and colonoscopy. Please follow the written instructions given to you at your visit today. Please pick up your prep supplies at the pharmacy within the next 1-3 days. If you use inhalers (even only as needed), please bring them with you on the day of your procedure.  Due to recent changes in healthcare laws, you may see the results of your imaging and laboratory studies on MyChart before your provider has had a chance to review them.  We understand that in some cases there may be results that are confusing or concerning to you. Not all laboratory results come back in the same time frame and the provider may be waiting for multiple results in order to interpret others.  Please give [place] 48 hours in order for your provider to thoroughly review all the results before contacting the office for clarification of your results.   1. Start Miralax - 1 capful once to twice daily if no bowel movement after 2-3 days then add Bisacodyl 10mg  once daily. Still if no bowel movement by 4-5 days then try Glycerin Suppositories or Enema. If no relief with bowel regimen then take Linzess.   2. Linzess 11mcg - Take 1 capsule once daily. (samples given.)  Thank you for choosing me and [facility].  Dr. Rush Landmark

## 2019-09-30 NOTE — Progress Notes (Signed)
[facility] VISIT   Primary Care Provider Celene Squibb, MD [place] [id number] [phone]  Referring Provider Celene Squibb, MD [address] [place],  [place] [address] [phone]  Patient Profile: Joy Heath is a 24 y.o. female with a pmh significant for POTS syndrome, PCOS, prior iron deficiency anemia, hypothyroidism, endometriosis status post resection, cholelithiasis, chronic constipation, reported hemorrhoids, obesity.  The patient presents to the [facility] for an evaluation and management of problem(s) noted below:  Problem List 1. RUQ pain   2. Calculus of gallbladder without cholecystitis without obstruction   3. Chronic constipation   4. Rectal bleeding   5. Gastroesophageal reflux disease, unspecified whether esophagitis present   6. Chronic nausea   7. History of iron deficiency anemia     History of Present Illness This is the patient's first visit to the outpatient [facility] clinic.  She has never seen GI in the past.  The patient describes that since being a child she has dealt with abdominal issues and constipation.  She has been on multiple acid reducing medications including Pepcid and Protonix and Tums and Zantac.  In high school is when really things began to be more significant especially with nausea.  The patient recalls having had constipation in the past and going every 2 to 3 days but now over the course the last few months she has experienced a progression of her symptoms.  She now may not use the restroom for a total of 5 days.  As she gets closer to day 4 and 5 she begins to get more bloated and distended and starts to feel worse.  She has used over-the-counter stool softeners as well as fleets enemas when it gets very bothersome for her.  When she relieves herself and has defecation, she has improvement in her bloating symptom.  She had been experiencing rectal bleeding on and  off for years however more recently she now is experiencing with every bowel movement although each bowel movement has been hard more recently.  She has discomfort in the anus most often when the bowel movement is passing through the anal canal.  She has never been on daily MiraLAX or other medications for constipation.  She recalls in 2017 she was started on Metformin for PCOS and she began to experience more GI symptoms.  After she stopped Metformin her symptoms at that point improved.  She does use Zofran for aid of nausea.  Starting in January of this year she began to experience a new abdominal discomfort mostly located in the right upper quadrant and midepigastric region.  She underwent an abdominal ultrasound as well as a HIDA scan performed by her primary care doctor which revealed cholelithiasis without cholecystitis.  As things progressed and she was trying to maintain a gluten-free and low-fat diet she continued to experience right upper quadrant issues postprandially most often within 30 and 60 minutes of eating.  She underwent repeat right upper quadrant ultrasound imaging during ED visit which again once again showed cholelithiasis.  She just saw general surgery yesterday and there are plans for potential cholecystectomy in the next few months.  I do not have access to those records as of yet.  There is a paternal grandfather who had stomach cancer in regards to family history but no other GI malignancies.  She has never had an upper or lower endoscopy.  She does not take significant nonsteroidals.  GI Review of  Systems Positive as above Negative for dysphagia, odynophagia, melena  Review of Systems General: Denies fevers/chills/weight loss HEENT: Denies oral lesions Cardiovascular: Denies chest pain/palpitations Pulmonary: Denies shortness of breath/cough Gastroenterological: See HPI Genitourinary: Denies darkened urine or hematuria Hematological: Denies easy bruising/bleeding Endocrine:  Denies temperature intolerance Dermatological: Denies jaundice Psychological: Mood is hopeful   Medications Current Outpatient Medications  Medication Sig Dispense Refill  . albuterol (VENTOLIN HFA) 108 (90 Base) MCG/ACT inhaler Inhale 1-2 puffs into the lungs every 6 (six) hours as needed for wheezing or shortness of breath.    . cetirizine-pseudoephedrine (ZYRTEC-D) 5-120 MG tablet Take 1 tablet by mouth as needed for allergies.     Marland Kitchen ondansetron (ZOFRAN ODT) 4 MG disintegrating tablet Take 1 tablet (4 mg total) by mouth every 8 (eight) hours as needed for nausea or vomiting. 6 tablet 0  . pantoprazole (PROTONIX) 40 MG tablet Take 40 mg by mouth daily.    . traMADol (ULTRAM) 50 MG tablet Take by mouth every 6 (six) hours as needed.    . Na Sulfate-K Sulfate-Mg Sulf (SUPREP BOWEL PREP KIT) 17.5-3.13-1.6 GM/177ML SOLN Take 1 kit by mouth as directed. For colonoscopy prep 354 mL 0   No current facility-administered medications for this visit.    Allergies No Known Allergies  Histories Past Medical History:  Diagnosis Date  . Endometriosis   . Gallstones 2021  . Hypothyroidism   . IDA (iron deficiency anemia)   . PCOS (polycystic ovarian syndrome)   . POTS (postural orthostatic tachycardia syndrome)    Past Surgical History:  Procedure Laterality Date  . ABDOMINAL EXPLORATION SURGERY  2010   endometriosis  . TONSILLECTOMY     Social History   Socioeconomic History  . Marital status: Single    Spouse name: Not on file  . Number of children: Not on file  . Years of education: Not on file  . Highest education level: Not on file  Occupational History  . Not on file  Tobacco Use  . Smoking status: Never Smoker  . Smokeless tobacco: Never Used  Substance and Sexual Activity  . Alcohol use: Yes    Comment: social  . Drug use: No  . Sexual activity: Not on file  Other Topics Concern  . Not on file  Social History Narrative  . Not on file   Social Determinants of  Health   Financial Resource Strain:   . Difficulty of Paying Living Expenses:   Food Insecurity:   . Worried About Charity fundraiser in the Last Year:   . Arboriculturist in the Last Year:   Transportation Needs:   . Film/video editor (Medical):   Marland Kitchen Lack of Transportation (Non-Medical):   Physical Activity:   . Days of Exercise per Week:   . Minutes of Exercise per Session:   Stress:   . Feeling of Stress :   Social Connections:   . Frequency of Communication with Friends and Family:   . Frequency of Social Gatherings with Friends and Family:   . Attends Religious Services:   . Active Member of Clubs or Organizations:   . Attends Archivist Meetings:   Marland Kitchen Marital Status:   Intimate Partner Violence:   . Fear of Current or Ex-Partner:   . Emotionally Abused:   Marland Kitchen Physically Abused:   . Sexually Abused:    Family History  Problem Relation Age of Onset  . Asthma Father   . Ovarian cancer Maternal Grandmother   .  Heart disease Maternal Grandmother   . Heart disease Maternal Grandfather   . Stomach cancer Paternal Grandfather 60  . Breast cancer Maternal Aunt   . Heart disease Other        great GM  . Colon cancer Neg Hx   . Esophageal cancer Neg Hx   . Inflammatory bowel disease Neg Hx   . Liver disease Neg Hx   . Pancreatic cancer Neg Hx   . Rectal cancer Neg Hx    I have reviewed her medical, social, and family history in detail and updated the electronic medical record as necessary.    PHYSICAL EXAMINATION  BP 96/72   Pulse 79   Temp 98.2 F (36.8 C)   Ht '5\' 5"'  (1.651 m)   Wt 284 lb (128.8 kg)   LMP 09/15/2019 (Exact Date)   SpO2 97%   BMI 47.26 kg/m  Wt Readings from Last 3 Encounters:  09/30/19 284 lb (128.8 kg)  09/07/19 280 lb (127 kg)  08/07/19 256 lb (116.1 kg)  GEN: NAD, appears stated age, doesn't appear chronically ill PSYCH: Cooperative, without pressured speech EYE: Conjunctivae pink, sclerae anicteric ENT: MMM, without oral  ulcers NECK: Supple, enlarged neck girth CV: RR without R/Gs  RESP: CTAB posteriorly, without wheezing GI: NABS, soft, protuberant abdomen, rounded, obese, tenderness to palpation in right upper quadrant midepigastrium upon deep palpation, no guarding or rebound, unable to appreciate hepatosplenomegaly due to body habitus  GU: DRE deferred as we are moving forward with colonoscopy MSK/EXT: Trace bilateral lower extremity edema SKIN: No jaundice NEURO:  Alert & Oriented x 3, no focal deficits   REVIEW OF DATA  I reviewed the following data at the time of this encounter:  GI Procedures and Studies  No relevant GI studies to review  Laboratory Studies  Reviewed in Epic  In Particular the following:  10/09/2015 22:55 12/09/2018 15:30 12/17/2018 18:36 08/07/2019 10:27 09/07/2019 14:23  AST '16 16 25 16 16  ' ALT 18 17 55 (H) 17 19  Total Bilirubin 0.4 0.2 (L) 0.3 0.4 0.3  Alkaline Phosphatase 45 59 54 57 61  Lipase 27   29 34    Imaging Studies  April 2021 RUQUS IMPRESSION: Cholelithiasis without sonographic evidence of cholecystitis or biliary dilatation.  March 2021 HIDA IMPRESSION: Patent biliary tree with normal gallbladder ejection fraction of 74% following fatty meal stimulation. Patient reported mild/dull abdominal pain following Ensure Ingestion.  February 2021 Abdominal U/S IMPRESSION: Cholelithiasis. No sonographic signs of cholecystitis, biliary ductal dilatation, or other significant abnormality.   ASSESSMENT  Ms. Sassano is a 24 y.o. female with a pmh significant for POTS syndrome, PCOS, prior iron deficiency anemia, hypothyroidism, endometriosis status post resection, cholelithiasis, chronic constipation, reported hemorrhoids, obesity.  The patient is seen today for evaluation and management of:  1. RUQ pain   2. Calculus of gallbladder without cholecystitis without obstruction   3. Chronic constipation   4. Rectal bleeding   5. Gastroesophageal reflux  disease, unspecified whether esophagitis present   6. Chronic nausea   7. History of iron deficiency anemia    The patient is hemodynamically stable.  Patient is more significant issues in the course of the last few months do sound to have a component of biliary colic.  In the setting of cholelithiasis, I do agree with the recent surgical evaluation suggesting the role of cholecystectomy.  Not sure we will help all of her symptoms but I do think that there is a significant component that could be  related to this and had a cholecystectomy will be helpful in that regard.  Guards to her more chronic symptoms that she is experience from a GI perspective it does seem like she likely has chronic idiopathic constipation but with the recent changes in her bowel habits and becoming more progressively constipated I do think that an endoscopic evaluation is reasonable.  We will plan to evaluate both an upper and lower perspective since she has upper GI symptoms as well.  Certainly is at risk in the setting of previous endometrial laparoscopies for small intestine bacterial overgrowth.  Exocrine pancreas insufficiency is unlikely but may be considered in the future if her gas/bloating continues although she is constipated certainly seems less likely to be an issue.  I think the focus will be on trying to optimize her bowel habits.  We will use laboratories to rule out serologic as well as metabolic etiologies for her issues.  Plan to proceed with diagnostic colonoscopy.  I will outline a bowel movement regimen as below for which she can use in hopes of helping her symptoms.  The risks and benefits of endoscopic evaluation were discussed with the patient; these include but are not limited to the risk of perforation, infection, bleeding, missed lesions, lack of diagnosis, severe illness requiring hospitalization, as well as anesthesia and sedation related illnesses.  The patient is agreeable to proceed.  All patient questions  were answered, to the best of my ability, and the patient agrees to the aforementioned plan of action with follow-up as indicated.   PLAN  Laboratories as outlined below Evaluate for iron deficiency in the setting of previous anemia per her report Bowel movement regimen -MiraLAX 1-2 times daily -If no bowel movement after 2 to 3 days then bisacodyl 10 mg for next 2 days -No bowel movement by day 4-5 then glycerin suppository and enema use -Tried these bowel regimen for the next 2 to 3 weeks and if not successful then proceed with Linzess 145 mcg daily FiberCon once daily to be initiated Diagnostic upper endoscopy with esophageal and gastric biopsies and duodenal biopsies Diagnostic colonoscopy to rule out etiology of rectal bleeding which is likely anal fissure RectiCare 2-3 times daily placed around the perineum as well as up into the first digit If anal fissure confirmed will consider role of diltiazem and/or nitroglycerin ointment Toileting technique to be further discussed after optimization of bowel movements   Orders Placed This Encounter  Procedures  . TSH  . Cortisol  . IBC + Ferritin  . IgA  . Tissue transglutaminase, IgA  . Ambulatory referral to Gastroenterology    New Prescriptions   NA SULFATE-K SULFATE-MG SULF (SUPREP BOWEL PREP KIT) 17.5-3.13-1.6 GM/177ML SOLN    Take 1 kit by mouth as directed. For colonoscopy prep   Modified Medications   No medications on file    Planned Follow Up No follow-ups on file.   Total Time in Face-to-Face and in Coordination of Care for patient including independent/personal interpretation/review of prior testing, medical history, examination, medication adjustment, communicating results with the patient directly, and documentation with the EHR is 45 minutes.   Justice Britain, MD [facility] Office # [phone]

## 2019-10-01 ENCOUNTER — Encounter: Payer: Self-pay | Admitting: Gastroenterology

## 2019-10-01 DIAGNOSIS — K8020 Calculus of gallbladder without cholecystitis without obstruction: Secondary | ICD-10-CM | POA: Insufficient documentation

## 2019-10-01 DIAGNOSIS — K625 Hemorrhage of anus and rectum: Secondary | ICD-10-CM | POA: Insufficient documentation

## 2019-10-01 DIAGNOSIS — K219 Gastro-esophageal reflux disease without esophagitis: Secondary | ICD-10-CM | POA: Insufficient documentation

## 2019-10-01 DIAGNOSIS — Z862 Personal history of diseases of the blood and blood-forming organs and certain disorders involving the immune mechanism: Secondary | ICD-10-CM | POA: Insufficient documentation

## 2019-10-01 DIAGNOSIS — K5909 Other constipation: Secondary | ICD-10-CM | POA: Insufficient documentation

## 2019-10-01 DIAGNOSIS — R1011 Right upper quadrant pain: Secondary | ICD-10-CM | POA: Insufficient documentation

## 2019-10-01 LAB — TISSUE TRANSGLUTAMINASE, IGA: (tTG) Ab, IgA: 1 U/mL

## 2019-10-02 DIAGNOSIS — R110 Nausea: Secondary | ICD-10-CM | POA: Insufficient documentation

## 2019-10-08 ENCOUNTER — Telehealth: Payer: Self-pay | Admitting: Gastroenterology

## 2019-10-08 NOTE — Telephone Encounter (Signed)
Pt reported that Linzess has been working for her and requested a prescription sent to [facility] in [place].

## 2019-10-09 ENCOUNTER — Ambulatory Visit: Payer: Self-pay | Admitting: Surgery

## 2019-10-09 MED ORDER — LINACLOTIDE 145 MCG PO CAPS: 145.0000 ug | ORAL_CAPSULE | Freq: Every day | ORAL | 3 refills | Status: AC

## 2019-10-09 NOTE — H&P (Signed)
CC: Referred for possible symptomatic cholelithiasis  HPI: Joy Heath is a very pleasant 24yoF with hx of PCOS, endometriosis who presents to our office for evaluation of possible symptomatic cholelithiasis. She states that the vast majority of her symptoms regarding this began in January of this year. She began experiencing significant intermittent crampy/dull/achy right upper quadrant abdominal pain. This was reliably brought on by greasy and fatty foods. It will last a few hours after eating and has been noted to radiate to her right scapula. She has been seen by other providers for these issues and has even tried dietary modifications. She states that dating back to her childhood, she has experienced issues with acid brash and has pretty much been on acid reducing medications and H2 blockers since that time. These never seemed to help her current symptoms. She has placed herself on a diet which is primarily water and rice. She has worked to avoid any sort of meat/greasy foods/gluten and if she maintains this very strict diet can minimize her symptoms. She denies any episodes where she had this pain associated with fevers or the pain lasting for more than a few hours. The pain in the morning when it occurs typically more dull and achy and if she were to have episodes in the evening, she reports to be more sharp.  she has undergone relatively extensive workup.  Abdominal ultrasound 07/25/19 showed 1.2 cm gallstone. No evidence of cholecystitis. CBD measured5 mm. She subsequently had a HIDA scan 08/12/19 which demonstrated normal ejection fractionthere was reproduction of some mild abdominal pain following Ensure. Subsequent right upper quadrant ultrasound 09/07/19 demonstrated a mobile gallstone without evidence of cholecystitis. CBD measured4 mm. LFTs and Ezma Rehm blood cell count were normal.  PMH: PCOS, endometriosis  [id number]; diagnostic laparoscopyfor endometriosis-she reports  having had some lesions removed in her pelvis as well as multiple ovarian cysts that were drained. She denies any other abdominal or pelvic surgical history.  FHx: Denies FHx of colorectal, breast, endometrial, ovarian or cervical cancer  Social: Denies use of tobacco/drugs; social EtOH use. 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 She is here today with her father whom is a Theme park manager in [place] and a Comptroller.  ROS: A comprehensive 10 system review of systems was completed with the patient and pertinent findings as noted above.  The patient is a 24 year old female.   Past Surgical History Geni Bers [place], RMA; 09/29/2019 2:25 PM) Tonsillectomy   Diagnostic Studies History Geni Bers [place], RMA; 09/29/2019 2:25 PM) Colonoscopy  never Mammogram  never Pap Smear  1-5 years ago  Allergies Geni Bers [place], RMA; 09/29/2019 2:33 PM) No Known Drug Allergies  [09/29/2019]: Allergies Reconciled   Medication History [employer], RMA; 09/29/2019 2:34 PM) Albuterol Sulfate HFA (108 (90 Base)MCG/ACT Aerosol Soln, Inhalation) Active. ZyrTEC Allergy (10MG  Tablet, Oral) Active. Ondansetron (4MG  Tablet Disint, Oral) Active. Pantoprazole Sodium (40MG  Tablet DR, Oral) Active. traMADol HCl (50MG  Tablet, Oral) Active. Medications Reconciled  Social History Geni Bers [place], RMA; 09/29/2019 2:25 PM) Alcohol use  Occasional alcohol use. Caffeine use  Carbonated beverages, Coffee, Tea. No drug use  Tobacco use  Never smoker.  Family History Geni Bers [place], RMA; 09/29/2019 2:25 PM) Arthritis  Family Members In General, Mother, Sister. Bleeding disorder  Family Members In General, Mother. Colon Polyps  Family Members In General, Father, Mother. Diabetes Mellitus  Family Members In General. Heart Disease  Family Members In General. Hypertension  Family Members In General, Father, Mother. Ischemic Bowel  Disease  Mother. Migraine Headache  Mother. Respiratory Condition  Father. Seizure disorder  Father, Mother.  Pregnancy / Birth History Geni Bers [place], RMA; 09/29/2019 2:25 PM) Age at menarche  79 years. Contraceptive History  Oral contraceptives. Gravida  0 Irregular periods  Para  0  Other Problems (Jacqueline Horace, RMA; 09/29/2019 2:25 PM) Back Pain  Gastroesophageal Reflux Disease  Other disease, cancer, significant illness  Thyroid Disease     Review of Systems Geni Bers [place] RMA; 09/29/2019 2:25 PM) General Present- Appetite Loss, Fatigue, Weight Gain and Weight Loss. Not Present- Chills, Fever and Night Sweats. HEENT Present- Earache, Hoarseness, Ringing in the Ears, Seasonal Allergies, Sore Throat and Wears glasses/contact lenses. Not Present- Hearing Loss, Nose Bleed, Oral Ulcers, Sinus Pain, Visual Disturbances and Yellow Eyes. Respiratory Not Present- Bloody sputum, Chronic Cough, Difficulty Breathing, Snoring and Wheezing. Breast Not Present- Breast Mass, Breast Pain, Nipple Discharge and Skin Changes. Cardiovascular Present- Palpitations and Rapid Heart Rate. Not Present- Chest Pain, Difficulty Breathing Lying Down, Leg Cramps, Shortness of Breath and Swelling of Extremities. Gastrointestinal Present- Abdominal Pain, Bloating, Constipation, Excessive gas, Gets full quickly at meals, Indigestion, Nausea and Vomiting. Not Present- Bloody Stool, Change in Bowel Habits, Chronic diarrhea, Difficulty Swallowing, Hemorrhoids and Rectal Pain. Female Genitourinary Present- Pelvic Pain and Urgency. Not Present- Frequency, Nocturia and Painful Urination. Musculoskeletal Present- Back Pain and Joint Stiffness. Not Present- Joint Pain, Muscle Pain, Muscle Weakness and Swelling of Extremities. Neurological Present- Weakness. Not Present- Decreased Memory, Fainting, Headaches, Numbness, Seizures, Tingling, Tremor and Trouble walking. Psychiatric Not Present- Anxiety,  Bipolar, Change in Sleep Pattern, Depression, Fearful and Frequent crying. Endocrine Not Present- Cold Intolerance, Excessive Hunger, Hair Changes, Heat Intolerance, Hot flashes and New Diabetes. Hematology Not Present- Blood Thinners, Easy Bruising, Excessive bleeding, Gland problems, HIV and Persistent Infections.  Vitals Geni Bers [place] RMA; 09/29/2019 2:34 PM) 09/29/2019 2:34 PM Weight: 283.4 lb Height: 65in Body Surface Area: 2.29 m Body Mass Index: 47.16 kg/m  Temp.: 97.66F (Temporal)  Pulse: 81 (Regular)  P.OX: 99% (Room air) BP: 98/76(Sitting, Left Arm, Standard)       Physical Exam Harrell Gave M. Zoiey Christy MD; 09/29/2019 3:06 PM) The physical exam findings are as follows: Note: Constitutional: No acute distress; conversant; no deformities Eyes: Moist conjunctiva; no lid lag; anicteric sclerae; pupils equal round and reactive to light Neck: Trachea midline; no palpable thyromegaly Lungs: Normal respiratory effort; no tactile fremitus CV: Regular rate and rhythm; no palpable thrill; no pitting edema GI: Abdomen obese, soft, nontender, nondistended; no palpable hepatosplenomegaly. No palpable masses or significant umbilical hernias MSK: Normal gait; no clubbing/cyanosis Psychiatric: Appropriate affect; alert and oriented 3 Lymphatic: No palpable cervical or axillary lymphadenopathy    Assessment & Plan Harrell Gave M. Leira Regino MD; 09/29/2019 3:09 PM) SYMPTOMATIC CHOLELITHIASIS (K80.20) Story: Ms. Housley is a very pleasant 5yoF with hx of endometriosis and PCOS - here for evaluation of what is most likely symptomatic cholelithiasis Impression: -The anatomy and physiology of the hepatobiliary system was discussed at length with the patient with associated pictures. The pathophysiology of gallbladder disease was discussed at length with associated pictures using our laparoscopic gallbladder surgery handout. -The options for treatment were discussed including ongoing  observation which may result in subsequent gallbladder complications (infection, pancreatitis, choledocholithiasis, etc) and surgery - laparoscopic and potential open techniques to cholecystectomy. -The procedure, material risks (including, but not limited to, pain, bleeding, infection, scarring, need for blood transfusion, damage to surrounding structures- blood vessels/nerves/viscus/organs, medication reactions, damage to bile duct, bile leak, need for additional procedures,  hernia, pancreatitis, pneumonia, heart attack, stroke, death) benefits and alternatives to surgery were discussed at length. I noted a good probability that the procedure would help improve her symptoms. we did specifically discuss that some of her abdominal symptoms that she has noted minimal bruit with surgery including the reflux-like symptoms in vague abdominal cramps/pains elsewhere in her abdomen. Additionally, constipation and bloating is likely to persist. The patient's questions were answered to her satisfaction, she voiced understanding and they elected to proceed with surgery. Additionally, we discussed typical postoperative expectations and the recovery process.  This patient encounter took 35 minutes today to perform the following: take history, perform exam, review outside records, interpret imaging, counsel the patient on their diagnosis and document encounter, findings & plan in the EHR  Signed by Ileana Roup, MD (09/29/2019 3:09 PM)

## 2019-10-09 NOTE — Telephone Encounter (Signed)
Glad to hear she is doing well. Please move forward with Linzess prescription. Thank you. GM

## 2019-10-09 NOTE — Telephone Encounter (Signed)
Rx for Linzess 130mcg sent to [facility]- pt informed.

## 2019-10-09 NOTE — Telephone Encounter (Signed)
Dr. Rush Landmark we gave pt samples of Linzess 172mcg once daily. Ok to send RX to pharmacy?

## 2019-10-09 NOTE — H&P (View-Only) (Signed)
CC: Referred for possible symptomatic cholelithiasis  HPI: Joy Heath is a very pleasant 22yoF with hx of PCOS, endometriosis who presents to our office for evaluation of possible symptomatic cholelithiasis. She states that the vast majority of her symptoms regarding this began in January of this year. She began experiencing significant intermittent crampy/dull/achy right upper quadrant abdominal pain. This was reliably brought on by greasy and fatty foods. It will last a few hours after eating and has been noted to radiate to her right scapula. She has been seen by other providers for these issues and has even tried dietary modifications. She states that dating back to her childhood, she has experienced issues with acid brash and has pretty much been on acid reducing medications and H2 blockers since that time. These never seemed to help her current symptoms. She has placed herself on a diet which is primarily water and rice. She has worked to avoid any sort of meat/greasy foods/gluten and if she maintains this very strict diet can minimize her symptoms. She denies any episodes where she had this pain associated with fevers or the pain lasting for more than a few hours. The pain in the morning when it occurs typically more dull and achy and if she were to have episodes in the evening, she reports to be more sharp.  she has undergone relatively extensive workup.  Abdominal ultrasound 07/25/19 showed 1.2 cm gallstone. No evidence of cholecystitis. CBD measured5 mm. She subsequently had a HIDA scan 08/12/19 which demonstrated normal ejection fractionthere was reproduction of some mild abdominal pain following Ensure. Subsequent right upper quadrant ultrasound 09/07/19 demonstrated a mobile gallstone without evidence of cholecystitis. CBD measured4 mm. LFTs and Laramie Gelles blood cell count were normal.  PMH: PCOS, endometriosis  [id number]; diagnostic laparoscopyfor endometriosis-she reports  having had some lesions removed in her pelvis as well as multiple ovarian cysts that were drained. She denies any other abdominal or pelvic surgical history.  FHx: Denies FHx of colorectal, breast, endometrial, ovarian or cervical cancer  Social: Denies use of tobacco/drugs; social EtOH use. 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 She is here today with her father whom is a Theme park manager in [place] and a Comptroller.  ROS: A comprehensive 10 system review of systems was completed with the patient and pertinent findings as noted above.  The patient is a 24 year old female.   Past Surgical History Geni Bers [place], RMA; 09/29/2019 2:25 PM) Tonsillectomy   Diagnostic Studies History Geni Bers [place], RMA; 09/29/2019 2:25 PM) Colonoscopy  never Mammogram  never Pap Smear  1-5 years ago  Allergies Geni Bers [place], RMA; 09/29/2019 2:33 PM) No Known Drug Allergies  [09/29/2019]: Allergies Reconciled   Medication History [employer], RMA; 09/29/2019 2:34 PM) Albuterol Sulfate HFA (108 (90 Base)MCG/ACT Aerosol Soln, Inhalation) Active. ZyrTEC Allergy (10MG  Tablet, Oral) Active. Ondansetron (4MG  Tablet Disint, Oral) Active. Pantoprazole Sodium (40MG  Tablet DR, Oral) Active. traMADol HCl (50MG  Tablet, Oral) Active. Medications Reconciled  Social History Geni Bers [place], RMA; 09/29/2019 2:25 PM) Alcohol use  Occasional alcohol use. Caffeine use  Carbonated beverages, Coffee, Tea. No drug use  Tobacco use  Never smoker.  Family History Geni Bers [place], RMA; 09/29/2019 2:25 PM) Arthritis  Family Members In General, Mother, Sister. Bleeding disorder  Family Members In General, Mother. Colon Polyps  Family Members In General, Father, Mother. Diabetes Mellitus  Family Members In General. Heart Disease  Family Members In General. Hypertension  Family Members In General, Father, Mother. Ischemic Bowel  Disease  Mother. Migraine Headache  Mother. Respiratory Condition  Father. Seizure disorder  Father, Mother.  Pregnancy / Birth History Geni Bers [place], RMA; 09/29/2019 2:25 PM) Age at menarche  42 years. Contraceptive History  Oral contraceptives. Gravida  0 Irregular periods  Para  0  Other Problems (Jacqueline [place], RMA; 09/29/2019 2:25 PM) Back Pain  Gastroesophageal Reflux Disease  Other disease, cancer, significant illness  Thyroid Disease     Review of Systems Geni Bers [place] RMA; 09/29/2019 2:25 PM) General Present- Appetite Loss, Fatigue, Weight Gain and Weight Loss. Not Present- Chills, Fever and Night Sweats. HEENT Present- Earache, Hoarseness, Ringing in the Ears, Seasonal Allergies, Sore Throat and Wears glasses/contact lenses. Not Present- Hearing Loss, Nose Bleed, Oral Ulcers, Sinus Pain, Visual Disturbances and Yellow Eyes. Respiratory Not Present- Bloody sputum, Chronic Cough, Difficulty Breathing, Snoring and Wheezing. Breast Not Present- Breast Mass, Breast Pain, Nipple Discharge and Skin Changes. Cardiovascular Present- Palpitations and Rapid Heart Rate. Not Present- Chest Pain, Difficulty Breathing Lying Down, Leg Cramps, Shortness of Breath and Swelling of Extremities. Gastrointestinal Present- Abdominal Pain, Bloating, Constipation, Excessive gas, Gets full quickly at meals, Indigestion, Nausea and Vomiting. Not Present- Bloody Stool, Change in Bowel Habits, Chronic diarrhea, Difficulty Swallowing, Hemorrhoids and Rectal Pain. Female Genitourinary Present- Pelvic Pain and Urgency. Not Present- Frequency, Nocturia and Painful Urination. Musculoskeletal Present- Back Pain and Joint Stiffness. Not Present- Joint Pain, Muscle Pain, Muscle Weakness and Swelling of Extremities. Neurological Present- Weakness. Not Present- Decreased Memory, Fainting, Headaches, Numbness, Seizures, Tingling, Tremor and Trouble walking. Psychiatric Not Present- Anxiety,  Bipolar, Change in Sleep Pattern, Depression, Fearful and Frequent crying. Endocrine Not Present- Cold Intolerance, Excessive Hunger, Hair Changes, Heat Intolerance, Hot flashes and New Diabetes. Hematology Not Present- Blood Thinners, Easy Bruising, Excessive bleeding, Gland problems, HIV and Persistent Infections.  Vitals Geni Bers [place] RMA; 09/29/2019 2:34 PM) 09/29/2019 2:34 PM Weight: 283.4 lb Height: 65in Body Surface Area: 2.29 m Body Mass Index: 47.16 kg/m  Temp.: 97.81F (Temporal)  Pulse: 81 (Regular)  P.OX: 99% (Room air) BP: 98/76(Sitting, Left Arm, Standard)       Physical Exam Harrell Gave M. Magda Muise MD; 09/29/2019 3:06 PM) The physical exam findings are as follows: Note: Constitutional: No acute distress; conversant; no deformities Eyes: Moist conjunctiva; no lid lag; anicteric sclerae; pupils equal round and reactive to light Neck: Trachea midline; no palpable thyromegaly Lungs: Normal respiratory effort; no tactile fremitus CV: Regular rate and rhythm; no palpable thrill; no pitting edema GI: Abdomen obese, soft, nontender, nondistended; no palpable hepatosplenomegaly. No palpable masses or significant umbilical hernias MSK: Normal gait; no clubbing/cyanosis Psychiatric: Appropriate affect; alert and oriented 3 Lymphatic: No palpable cervical or axillary lymphadenopathy    Assessment & Plan Harrell Gave M. Eder Macek MD; 09/29/2019 3:09 PM) SYMPTOMATIC CHOLELITHIASIS (K80.20) Story: Ms. Hosick is a very pleasant 38yoF with hx of endometriosis and PCOS - here for evaluation of what is most likely symptomatic cholelithiasis Impression: -The anatomy and physiology of the hepatobiliary system was discussed at length with the patient with associated pictures. The pathophysiology of gallbladder disease was discussed at length with associated pictures using our laparoscopic gallbladder surgery handout. -The options for treatment were discussed including ongoing  observation which may result in subsequent gallbladder complications (infection, pancreatitis, choledocholithiasis, etc) and surgery - laparoscopic and potential open techniques to cholecystectomy. -The procedure, material risks (including, but not limited to, pain, bleeding, infection, scarring, need for blood transfusion, damage to surrounding structures- blood vessels/nerves/viscus/organs, medication reactions, damage to bile duct, bile leak, need for additional procedures,  hernia, pancreatitis, pneumonia, heart attack, stroke, death) benefits and alternatives to surgery were discussed at length. I noted a good probability that the procedure would help improve her symptoms. we did specifically discuss that some of her abdominal symptoms that she has noted minimal bruit with surgery including the reflux-like symptoms in vague abdominal cramps/pains elsewhere in her abdomen. Additionally, constipation and bloating is likely to persist. The patient's questions were answered to her satisfaction, she voiced understanding and they elected to proceed with surgery. Additionally, we discussed typical postoperative expectations and the recovery process.  This patient encounter took 35 minutes today to perform the following: take history, perform exam, review outside records, interpret imaging, counsel the patient on their diagnosis and document encounter, findings & plan in the EHR  Signed by Ileana Roup, MD (09/29/2019 3:09 PM)

## 2019-10-10 NOTE — Patient Instructions (Addendum)
DUE TO COVID-19 ONLY ONE VISITOR IS ALLOWED TO COME WITH YOU AND STAY IN THE WAITING ROOM ONLY DURING PRE OP AND PROCEDURE DAY OF SURGERY. TWO  VISITOR MAY VISIT WITH YOU AFTER SURGERY IN YOUR PRIVATE ROOM DURING VISITING HOURS ONLY!  10a-8p  YOU NEED TO HAVE A COVID 19 TEST ON_5-20-21______ @___  2:05____, THIS TEST MUST BE DONE BEFORE SURGERY, COME  [address], [place] , [address].  (Joseph) ONCE YOUR COVID TEST IS COMPLETED, PLEASE BEGIN THE QUARANTINE INSTRUCTIONS AS OUTLINED IN YOUR HANDOUT.                Joy Heath  10/10/2019   Your procedure is scheduled on: 10-20-19   Report to [facility] Main  Entrance   Report to admitting at    11:00 AM     Call this number if you have problems the morning of surgery [phone]    Remember: Do not eat food  :After Midnight.  You may have clear liquids until 0700 am then nothing by mouth    CLEAR LIQUID DIET   Foods Allowed                                                                                            Foods Excluded  Coffee and tea, regular and decaf  NO CREAMER                               liquids that you cannot  Plain Jell-O any favor except red or purple                                           see through such as: Fruit ices (not with fruit pulp)                                                                     milk, soups, orange juice  Iced Popsicles                                                                     All solid food                                    Cranberry, grape and apple juices Sports drinks like Gatorade Lightly seasoned clear broth or consume(fat free) Sugar, honey syrup _____________________________________________________________________    BRUSH YOUR TEETH MORNING OF  SURGERY AND RINSE YOUR MOUTH OUT, NO CHEWING GUM CANDY OR MINTS.     Take these medicines the morning of surgery with A SIP OF WATER: protonix, zyrtec, inhaler bring                                  You may not have any metal on your body including hair pins and              piercings  Do not wear jewelry, make-up, lotions, powders or perfumes, deodorant             Do not wear nail polish on your fingernails.  Do not shave  48 hours prior to surgery.           Do not bring valuables to the hospital. [place].  Contacts, dentures or bridgework may not be worn into surgery. .     Patients discharged the day of surgery will not be allowed to drive home. IF YOU ARE HAVING SURGERY AND GOING HOME THE SAME DAY, YOU MUST HAVE AN ADULT TO DRIVE YOU HOME AND BE WITH YOU FOR 24 HOURS. YOU MAY GO HOME BY TAXI OR UBER OR ORTHERWISE, BUT AN ADULT MUST ACCOMPANY YOU HOME AND STAY WITH YOU FOR 24 HOURS.  Name and phone number of your driver:  Special Instructions: N/A              Please read over the following fact sheets you were given: _____________________________________________________________________             [facility] - Preparing for Surgery Before surgery, you can play an important role.  Because skin is not sterile, your skin needs to be as free of germs as possible.  You can reduce the number of germs on your skin by washing with CHG (chlorahexidine gluconate) soap before surgery.  CHG is an antiseptic cleaner which kills germs and bonds with the skin to continue killing germs even after washing. Please DO NOT use if you have an allergy to CHG or antibacterial soaps.  If your skin becomes reddened/irritated stop using the CHG and inform your nurse when you arrive at Short Stay. Do not shave (including legs and underarms) for at least 48 hours prior to the first CHG shower.  You may shave your face/neck. Please follow these instructions carefully:  1.  Shower with CHG Soap the night before surgery and the  morning of Surgery.  2.  If you choose to wash your hair, wash your hair first as usual with your   normal  shampoo.  3.  After you shampoo, rinse your hair and body thoroughly to remove the  shampoo.                           4.  Use CHG as you would any other liquid soap.  You can apply chg directly  to the skin and wash                       Gently with a scrungie or clean washcloth.  5.  Apply the CHG Soap to your body ONLY FROM THE NECK DOWN.   Do not use on face/ open  Wound or open sores. Avoid contact with eyes, ears mouth and genitals (private parts).                       Wash face,  Genitals (private parts) with your normal soap.             6.  Wash thoroughly, paying special attention to the area where your surgery  will be performed.  7.  Thoroughly rinse your body with warm water from the neck down.  8.  DO NOT shower/wash with your normal soap after using and rinsing off  the CHG Soap.                9.  Pat yourself dry with a clean towel.            10.  Wear clean pajamas.            11.  Place clean sheets on your bed the night of your first shower and do not  sleep with pets. Day of Surgery : Do not apply any lotions/deodorants the morning of surgery.  Please wear clean clothes to the hospital/surgery center.  FAILURE TO FOLLOW THESE INSTRUCTIONS MAY RESULT IN THE CANCELLATION OF YOUR SURGERY PATIENT SIGNATURE_________________________________  NURSE SIGNATURE__________________________________  ________________________________________________________________________

## 2019-10-15 ENCOUNTER — Encounter (HOSPITAL_COMMUNITY)
Admission: RE | Admit: 2019-10-15 | Discharge: 2019-10-15 | Disposition: A | Discharge status: Home / Self Care | Payer: BC Managed Care – PPO | Source: Ambulatory Visit | Attending: Surgery | Admitting: Surgery

## 2019-10-15 ENCOUNTER — Encounter (HOSPITAL_COMMUNITY): Payer: Self-pay

## 2019-10-15 ENCOUNTER — Other Ambulatory Visit: Payer: Self-pay

## 2019-10-15 DIAGNOSIS — Z20822 Contact with and (suspected) exposure to covid-19: Secondary | ICD-10-CM | POA: Insufficient documentation

## 2019-10-15 DIAGNOSIS — Z01812 Encounter for preprocedural laboratory examination: Secondary | ICD-10-CM | POA: Diagnosis not present

## 2019-10-15 HISTORY — DX: Headache, unspecified: R51.9

## 2019-10-15 HISTORY — DX: Gastro-esophageal reflux disease without esophagitis: K21.9

## 2019-10-15 HISTORY — DX: Other hypotension: I95.89

## 2019-10-16 ENCOUNTER — Other Ambulatory Visit (HOSPITAL_COMMUNITY)
Admission: RE | Admit: 2019-10-16 | Discharge: 2019-10-16 | Disposition: A | Discharge status: Home / Self Care | Payer: BC Managed Care – PPO | Source: Ambulatory Visit | Attending: Surgery | Admitting: Surgery

## 2019-10-16 ENCOUNTER — Encounter (HOSPITAL_COMMUNITY)
Admission: RE | Admit: 2019-10-16 | Discharge: 2019-10-16 | Disposition: A | Discharge status: Home / Self Care | Payer: BC Managed Care – PPO | Source: Ambulatory Visit | Attending: Surgery | Admitting: Surgery

## 2019-10-16 DIAGNOSIS — Z01812 Encounter for preprocedural laboratory examination: Secondary | ICD-10-CM | POA: Diagnosis not present

## 2019-10-16 DIAGNOSIS — Z20822 Contact with and (suspected) exposure to covid-19: Secondary | ICD-10-CM | POA: Diagnosis not present

## 2019-10-16 LAB — COMPREHENSIVE METABOLIC PANEL
ALT: 15 U/L (ref 0–44)
AST: 16 U/L (ref 15–41)
Albumin: 4.2 g/dL (ref 3.5–5.0)
Alkaline Phosphatase: 51 U/L (ref 38–126)
Anion gap: 10 (ref 5–15)
BUN: 18 mg/dL (ref 6–20)
CO2: 24 mmol/L (ref 22–32)
Calcium: 8.8 mg/dL — ABNORMAL LOW (ref 8.9–10.3)
Chloride: 107 mmol/L (ref 98–111)
Creatinine, Ser: 1.27 mg/dL — ABNORMAL HIGH (ref 0.44–1.00)
GFR calc Af Amer: >60 mL/min (ref >60)
GFR calc non Af Amer: 59 mL/min — ABNORMAL LOW (ref >60)
Glucose, Bld: 92 mg/dL (ref 70–99)
Potassium: 3.9 mmol/L (ref 3.5–5.1)
Sodium: 141 mmol/L (ref 135–145)
Total Bilirubin: 0.3 mg/dL (ref 0.3–1.2)
Total Protein: 7.8 g/dL (ref 6.5–8.1)

## 2019-10-16 LAB — CBC WITH DIFFERENTIAL/PLATELET
Abs Immature Granulocytes: 0.03 10*3/uL (ref 0.00–0.07)
Basophils Absolute: 0 10*3/uL (ref 0.0–0.1)
Basophils Relative: 0 %
Eosinophils Absolute: 0.3 10*3/uL (ref 0.0–0.5)
Eosinophils Relative: 4 %
HCT: 37.7 % (ref 36.0–46.0)
Hemoglobin: 11.7 g/dL — ABNORMAL LOW (ref 12.0–15.0)
Immature Granulocytes: 0 %
Lymphocytes Relative: 20 %
Lymphs Abs: 1.9 10*3/uL (ref 0.7–4.0)
MCH: 24.8 pg — ABNORMAL LOW (ref 26.0–34.0)
MCHC: 31 g/dL (ref 30.0–36.0)
MCV: 79.9 fL — ABNORMAL LOW (ref 80.0–100.0)
Monocytes Absolute: 0.5 10*3/uL (ref 0.1–1.0)
Monocytes Relative: 5 %
Neutro Abs: 6.5 10*3/uL (ref 1.7–7.7)
Neutrophils Relative %: 71 %
Platelets: 296 10*3/uL (ref 150–400)
RBC: 4.72 MIL/uL (ref 3.87–5.11)
RDW: 14.3 % (ref 11.5–15.5)
WBC: 9.2 10*3/uL (ref 4.0–10.5)
nRBC: 0 % (ref 0.0–0.2)

## 2019-10-16 LAB — PROTIME-INR
INR: 1.1 (ref 0.8–1.2)
Prothrombin Time: 13.7 seconds (ref 11.4–15.2)

## 2019-10-16 LAB — APTT: aPTT: 35 seconds (ref 24–36)

## 2019-10-17 LAB — ABO/RH: ABO/RH(D): O NEG

## 2019-10-17 LAB — SARS CORONAVIRUS 2 (TAT 6-24 HRS): SARS Coronavirus 2: NEGATIVE

## 2019-10-19 MED ORDER — DEXTROSE 5 % IV SOLN
3.0000 g | INTRAVENOUS | Status: AC
  Administered 2019-10-20: 3 g via INTRAVENOUS
  Filled 2019-10-19: qty 3

## 2019-10-20 ENCOUNTER — Ambulatory Visit (HOSPITAL_COMMUNITY)
Admission: RE | Admit: 2019-10-20 | Discharge: 2019-10-20 | Disposition: A | Discharge status: Home / Self Care | Payer: BC Managed Care – PPO | Attending: Surgery | Admitting: Surgery

## 2019-10-20 ENCOUNTER — Ambulatory Visit (HOSPITAL_COMMUNITY): Payer: BC Managed Care – PPO | Admitting: Anesthesiology

## 2019-10-20 ENCOUNTER — Encounter (HOSPITAL_COMMUNITY)
Admission: RE | Disposition: A | Discharge status: Home / Self Care | Payer: Self-pay | Source: Home / Self Care | Attending: Surgery

## 2019-10-20 ENCOUNTER — Encounter (HOSPITAL_COMMUNITY): Payer: Self-pay | Admitting: Surgery

## 2019-10-20 ENCOUNTER — Other Ambulatory Visit: Payer: Self-pay

## 2019-10-20 DIAGNOSIS — K8010 Calculus of gallbladder with chronic cholecystitis without obstruction: Secondary | ICD-10-CM | POA: Diagnosis not present

## 2019-10-20 DIAGNOSIS — K5909 Other constipation: Secondary | ICD-10-CM | POA: Diagnosis not present

## 2019-10-20 DIAGNOSIS — Z6841 Body Mass Index (BMI) 40.0 and over, adult: Secondary | ICD-10-CM | POA: Diagnosis not present

## 2019-10-20 DIAGNOSIS — K219 Gastro-esophageal reflux disease without esophagitis: Secondary | ICD-10-CM | POA: Diagnosis not present

## 2019-10-20 DIAGNOSIS — E039 Hypothyroidism, unspecified: Secondary | ICD-10-CM | POA: Diagnosis not present

## 2019-10-20 HISTORY — PX: CHOLECYSTECTOMY: SHX55

## 2019-10-20 LAB — TYPE AND SCREEN
ABO/RH(D): O NEG
Antibody Screen: NEGATIVE

## 2019-10-20 LAB — PREGNANCY, URINE: Preg Test, Ur: NEGATIVE

## 2019-10-20 SURGERY — LAPAROSCOPIC CHOLECYSTECTOMY
Anesthesia: General

## 2019-10-20 MED ORDER — PROMETHAZINE HCL 25 MG/ML IJ SOLN
INTRAMUSCULAR | Status: AC
  Filled 2019-10-20: qty 1

## 2019-10-20 MED ORDER — ACETAMINOPHEN 500 MG PO TABS
1000.0000 mg | ORAL_TABLET | ORAL | Status: AC
  Administered 2019-10-20: 1000 mg via ORAL
  Filled 2019-10-20: qty 2

## 2019-10-20 MED ORDER — DEXAMETHASONE SODIUM PHOSPHATE 10 MG/ML IJ SOLN
INTRAMUSCULAR | Status: AC
  Filled 2019-10-20: qty 1

## 2019-10-20 MED ORDER — FENTANYL CITRATE (PF) 100 MCG/2ML IJ SOLN
INTRAMUSCULAR | Status: AC
  Filled 2019-10-20: qty 2

## 2019-10-20 MED ORDER — MIDAZOLAM HCL 2 MG/2ML IJ SOLN
INTRAMUSCULAR | Status: AC
  Filled 2019-10-20: qty 2

## 2019-10-20 MED ORDER — FENTANYL CITRATE (PF) 100 MCG/2ML IJ SOLN
25.0000 ug | INTRAMUSCULAR | Status: DC | PRN
  Administered 2019-10-20: 25 ug via INTRAVENOUS
  Administered 2019-10-20: 50 ug via INTRAVENOUS

## 2019-10-20 MED ORDER — KETOROLAC TROMETHAMINE 30 MG/ML IJ SOLN
INTRAMUSCULAR | Status: AC
  Filled 2019-10-20: qty 1

## 2019-10-20 MED ORDER — TRAMADOL HCL 50 MG PO TABS: 50.0000 mg | ORAL_TABLET | Freq: Four times a day (QID) | ORAL | Status: DC | PRN

## 2019-10-20 MED ORDER — LACTATED RINGERS IV SOLN
INTRAVENOUS | Status: DC | PRN
  Administered 2019-10-20: 1000 mL

## 2019-10-20 MED ORDER — ACETAMINOPHEN 10 MG/ML IV SOLN
INTRAVENOUS | Status: AC
  Filled 2019-10-20: qty 100

## 2019-10-20 MED ORDER — FENTANYL CITRATE (PF) 100 MCG/2ML IJ SOLN
INTRAMUSCULAR | Status: DC | PRN
  Administered 2019-10-20: 50 ug via INTRAVENOUS
  Administered 2019-10-20: 25 ug via INTRAVENOUS
  Administered 2019-10-20 (×3): 50 ug via INTRAVENOUS
  Administered 2019-10-20: 25 ug via INTRAVENOUS
  Administered 2019-10-20: 50 ug via INTRAVENOUS

## 2019-10-20 MED ORDER — ONDANSETRON HCL 4 MG/2ML IJ SOLN
INTRAMUSCULAR | Status: AC
  Filled 2019-10-20: qty 2

## 2019-10-20 MED ORDER — TRAMADOL HCL 50 MG PO TABS: 50.0000 mg | ORAL_TABLET | Freq: Four times a day (QID) | ORAL | 0 refills | Status: AC | PRN

## 2019-10-20 MED ORDER — LIDOCAINE HCL (CARDIAC) PF 100 MG/5ML IV SOSY
PREFILLED_SYRINGE | INTRAVENOUS | Status: DC | PRN
  Administered 2019-10-20: 50 mg via INTRAVENOUS

## 2019-10-20 MED ORDER — BUPIVACAINE-EPINEPHRINE 0.5% -1:200000 IJ SOLN
INTRAMUSCULAR | Status: DC | PRN
  Administered 2019-10-20: 20 mL

## 2019-10-20 MED ORDER — 0.9 % SODIUM CHLORIDE (POUR BTL) OPTIME
TOPICAL | Status: DC | PRN
  Administered 2019-10-20: 1000 mL

## 2019-10-20 MED ORDER — OXYCODONE HCL 5 MG PO TABS
5.0000 mg | ORAL_TABLET | Freq: Once | ORAL | Status: AC | PRN
  Administered 2019-10-20: 5 mg via ORAL

## 2019-10-20 MED ORDER — ROCURONIUM BROMIDE 10 MG/ML (PF) SYRINGE
PREFILLED_SYRINGE | INTRAVENOUS | Status: AC
  Filled 2019-10-20: qty 10

## 2019-10-20 MED ORDER — MIDAZOLAM HCL 5 MG/5ML IJ SOLN
INTRAMUSCULAR | Status: DC | PRN
  Administered 2019-10-20: 2 mg via INTRAVENOUS

## 2019-10-20 MED ORDER — KETOROLAC TROMETHAMINE 30 MG/ML IJ SOLN
30.0000 mg | Freq: Once | INTRAMUSCULAR | Status: AC
  Administered 2019-10-20: 30 mg via INTRAVENOUS

## 2019-10-20 MED ORDER — ROCURONIUM BROMIDE 100 MG/10ML IV SOLN
INTRAVENOUS | Status: DC | PRN
  Administered 2019-10-20: 15 mg via INTRAVENOUS
  Administered 2019-10-20: 60 mg via INTRAVENOUS

## 2019-10-20 MED ORDER — CHLORHEXIDINE GLUCONATE CLOTH 2 % EX PADS: 6.0000 | MEDICATED_PAD | Freq: Once | CUTANEOUS | Status: DC

## 2019-10-20 MED ORDER — LIDOCAINE 2% (20 MG/ML) 5 ML SYRINGE
INTRAMUSCULAR | Status: AC
  Filled 2019-10-20: qty 5

## 2019-10-20 MED ORDER — OXYCODONE HCL 5 MG PO TABS
ORAL_TABLET | ORAL | Status: AC
  Filled 2019-10-20: qty 1

## 2019-10-20 MED ORDER — SUGAMMADEX SODIUM 500 MG/5ML IV SOLN
INTRAVENOUS | Status: DC | PRN
  Administered 2019-10-20: 350 mg via INTRAVENOUS

## 2019-10-20 MED ORDER — PROMETHAZINE HCL 25 MG/ML IJ SOLN
6.2500 mg | INTRAMUSCULAR | Status: AC | PRN
  Administered 2019-10-20 (×2): 12.5 mg via INTRAVENOUS

## 2019-10-20 MED ORDER — BUPIVACAINE-EPINEPHRINE 0.5% -1:200000 IJ SOLN
INTRAMUSCULAR | Status: AC
  Filled 2019-10-20: qty 1

## 2019-10-20 MED ORDER — ACETAMINOPHEN 10 MG/ML IV SOLN
1000.0000 mg | Freq: Once | INTRAVENOUS | Status: AC
  Administered 2019-10-20: 1000 mg via INTRAVENOUS

## 2019-10-20 MED ORDER — DEXAMETHASONE SODIUM PHOSPHATE 10 MG/ML IJ SOLN
INTRAMUSCULAR | Status: DC | PRN
  Administered 2019-10-20: 8 mg via INTRAVENOUS

## 2019-10-20 MED ORDER — LACTATED RINGERS IV SOLN: INTRAVENOUS | Status: DC

## 2019-10-20 MED ORDER — PROPOFOL 10 MG/ML IV BOLUS
INTRAVENOUS | Status: DC | PRN
  Administered 2019-10-20: 200 mg via INTRAVENOUS

## 2019-10-20 MED ORDER — ONDANSETRON HCL 4 MG/2ML IJ SOLN
INTRAMUSCULAR | Status: DC | PRN
  Administered 2019-10-20: 4 mg via INTRAVENOUS

## 2019-10-20 MED ORDER — ONDANSETRON 4 MG PO TBDP
4.0000 mg | ORAL_TABLET | Freq: Three times a day (TID) | ORAL | 0 refills | Status: DC | PRN
Start: 2019-10-20 — End: 2019-11-21

## 2019-10-20 MED ORDER — OXYCODONE HCL 5 MG/5ML PO SOLN: 5.0000 mg | Freq: Once | ORAL | Status: AC | PRN

## 2019-10-20 MED ORDER — PROPOFOL 10 MG/ML IV BOLUS
INTRAVENOUS | Status: AC
  Filled 2019-10-20: qty 20

## 2019-10-20 MED ORDER — SUGAMMADEX SODIUM 500 MG/5ML IV SOLN
INTRAVENOUS | Status: AC
  Filled 2019-10-20: qty 5

## 2019-10-20 SURGICAL SUPPLY — 41 items
APPLICATOR ARISTA FLEXITIP XL (MISCELLANEOUS) IMPLANT
APPLIER CLIP 5 13 M/L LIGAMAX5 (MISCELLANEOUS) ×2
APPLIER CLIP ROT 10 11.4 M/L (STAPLE)
CABLE HIGH FREQUENCY MONO STRZ (ELECTRODE) ×2 IMPLANT
CHLORAPREP W/TINT 26 (MISCELLANEOUS) ×2 IMPLANT
CLIP APPLIE 5 13 M/L LIGAMAX5 (MISCELLANEOUS) ×1 IMPLANT
CLIP APPLIE ROT 10 11.4 M/L (STAPLE) IMPLANT
COVER MAYO STAND STRL (DRAPES) IMPLANT
COVER SURGICAL LIGHT HANDLE (MISCELLANEOUS) ×2 IMPLANT
COVER WAND RF STERILE (DRAPES) ×1 IMPLANT
DECANTER SPIKE VIAL GLASS SM (MISCELLANEOUS) ×2 IMPLANT
DERMABOND ADVANCED (GAUZE/BANDAGES/DRESSINGS) ×1
DERMABOND ADVANCED .7 DNX12 (GAUZE/BANDAGES/DRESSINGS) ×1 IMPLANT
DISSECTOR BLUNT TIP ENDO 5MM (MISCELLANEOUS) IMPLANT
DRAPE C-ARM 42X120 X-RAY (DRAPES) IMPLANT
ELECT PENCIL ROCKER SW 15FT (MISCELLANEOUS) ×2 IMPLANT
ELECT REM PT RETURN 15FT ADLT (MISCELLANEOUS) ×2 IMPLANT
GLOVE BIO SURGEON STRL SZ7.5 (GLOVE) ×2 IMPLANT
GLOVE INDICATOR 8.0 STRL GRN (GLOVE) ×2 IMPLANT
GOWN STRL REUS W/TWL XL LVL3 (GOWN DISPOSABLE) ×4 IMPLANT
GRASPER SUT TROCAR 14GX15 (MISCELLANEOUS) IMPLANT
HEMOSTAT ARISTA ABSORB 3G PWDR (HEMOSTASIS) IMPLANT
HEMOSTAT SNOW SURGICEL 2X4 (HEMOSTASIS) IMPLANT
KIT BASIN (CUSTOM PROCEDURE TRAY) ×2 IMPLANT
NDL INSUFFLATION 14GA 120MM (NEEDLE) IMPLANT
NEEDLE INSUFFLATION 14GA 120MM (NEEDLE) IMPLANT
POUCH SPECIMEN RETRIEVAL 10MM (ENDOMECHANICALS) ×2 IMPLANT
SCISSORS LAP 5X35 DISP (ENDOMECHANICALS) ×2 IMPLANT
SET CHOLANGIOGRAPH MIX (MISCELLANEOUS) IMPLANT
SET IRRIG TUBING LAPAROSCOPIC (IRRIGATION / IRRIGATOR) ×2 IMPLANT
SET TUBE SMOKE EVAC HIGH FLOW (TUBING) ×2 IMPLANT
SLEEVE ADV FIXATION 5X100MM (TROCAR) ×4 IMPLANT
SUT MNCRL AB 4-0 PS2 18 (SUTURE) ×2 IMPLANT
SYR 10ML ECCENTRIC (SYRINGE) ×2 IMPLANT
TOWEL OR 17X26 10 PK STRL BLUE (TOWEL DISPOSABLE) ×2 IMPLANT
TOWEL OR NON WOVEN STRL DISP B (DISPOSABLE) IMPLANT
TRAY LAPAROSCOPIC (CUSTOM PROCEDURE TRAY) ×2 IMPLANT
TROCAR ADV FIXATION 12X100MM (TROCAR) IMPLANT
TROCAR ADV FIXATION 5X100MM (TROCAR) ×2 IMPLANT
TROCAR XCEL BLUNT TIP 100MML (ENDOMECHANICALS) ×2 IMPLANT
TROCAR XCEL NON-BLD 11X100MML (ENDOMECHANICALS) IMPLANT

## 2019-10-20 NOTE — Discharge Instructions (Signed)
POST OP INSTRUCTIONS ° °1. DIET: As tolerated. Follow a light bland diet the first 24 hours after arrival home, such as soup, liquids, crackers, etc.  Be sure to include lots of fluids daily.  Avoid fast food or heavy meals as your are more likely to get nauseated.  Eat a low fat the next few days after surgery. ° °2. Take your usually prescribed home medications unless otherwise directed. ° °3. PAIN CONTROL: °a. Pain is best controlled by a usual combination of three different methods TOGETHER: °i. Ice/Heat °ii. Over the counter pain medication °iii. Prescription pain medication °b. Most patients will experience some swelling and bruising around the surgical site.  Ice packs or heating pads (30-60 minutes up to 6 times a day) will help. Some people prefer to use ice alone, heat alone, alternating between ice & heat.  Experiment to what works for you.  Swelling and bruising can take several weeks to resolve.   °c. It is helpful to take an over-the-counter pain medication regularly for the first few weeks: °i. Ibuprofen (Motrin/Advil) - 200mg tabs - take 3 tabs (600mg) every 6 hours as needed for pain °ii. Acetaminophen (Tylenol) - you may take 650mg every 6 hours as needed. You can take this with motrin as they act differently on the body. If you are taking a narcotic pain medication that has acetaminophen in it, do not take over the counter tylenol at the same time. ° Iii. NOTE: You may take both of these medications together - most patients  find it most helpful when alternating between the two (i.e. Ibuprofen at 6am,  tylenol at 9am, ibuprofen at 12pm ...) °d. A  prescription for pain medication should be given to you upon discharge.  Take your pain medication as prescribed if your pain is not adequatly controlled with the over-the-counter pain reliefs mentioned above. ° °4. Avoid getting constipated.  Between the surgery and the pain medications, it is common to experience some constipation.  Increasing fluid  intake and taking a fiber supplement (such as Metamucil, Citrucel, FiberCon, MiraLax, etc) 1-2 times a day regularly will usually help prevent this problem from occurring.  A mild laxative (prune juice, Milk of Magnesia, MiraLax, etc) should be taken according to package directions if there are no bowel movements after 48 hours.   ° °5. Dressing: Your incisions are covered in Dermabond which is like sterile superglue for the skin. This will come off on it's own in a couple weeks. It is waterproof and you may bathe normally starting the day after your surgery in a shower. Avoid baths/pools/lakes/oceans until your wounds have fully healed. ° °6. ACTIVITIES as tolerated:   °a. Avoid heavy lifting (>10lbs or 1 gallon of milk) for the next 6 weeks. °b. You may resume regular (light) daily activities beginning the next day--such as daily self-care, walking, climbing stairs--gradually increasing activities as tolerated.  If you can walk 30 minutes without difficulty, it is safe to try more intense activity such as jogging, treadmill, bicycling, low-impact aerobics.  °c. DO NOT PUSH THROUGH PAIN.  Let pain be your guide: If it hurts to do something, don't do it. °d. You may drive when you are no longer taking prescription pain medication, you can comfortably wear a seatbelt, and you can safely maneuver your car and apply brakes. °e. You may have sexual intercourse when it is comfortable.  ° °7. FOLLOW UP in our office °a. Please call [facility] at [phone] to set up an appointment   to see your surgeon in the office for a follow-up appointment approximately 2 weeks after your surgery. °b. Make sure that you call for this appointment the day you arrive home to insure a convenient appointment time. ° °9. If you have disability or family leave forms that need to be completed, you may have them completed by your primary care physician's office; for return to work instructions, please ask our office staff and they will be happy to  assist you in obtaining this documentation °  °When to call us [phone]: °1. Poor pain control °2. Reactions / problems with new medications (rash/itching, etc)  °3. Fever over 101.5 F (38.5 C) °4. Inability to urinate °5. Nausea/vomiting °6. Worsening swelling or bruising °7. Continued bleeding from incision. °8. Increased pain, redness, or drainage from the incision ° °The clinic staff is available to answer your questions during regular business hours (8:30am-5pm).  Please don’t hesitate to call and ask to speak to one of our nurses for clinical concerns.   A surgeon from [facility] is always on call at the hospitals °  °If you have a medical emergency, go to the nearest emergency room or call 911. ° °[facility] °[address], [address], [place], [place]  [address] °MAIN: [phone] °FAX: [phone] °[email or web address] ° ° °

## 2019-10-20 NOTE — Interval H&P Note (Signed)
History and Physical Interval Note:  10/20/2019 12:08 PM  Joy Heath  has presented today for surgery, with the diagnosis of SYMPTOMATIC CHOLELITHIASIS.  The various methods of treatment have been discussed with the patient again today. After consideration of risks, benefits and other options for treatment, the patient has consented to  Procedure(s): LAPAROSCOPIC CHOLECYSTECTOMY (N/A) as a surgical intervention. WE also reviewed scenarios where a subtotal cholecystectomy may be performed and what that would entail - including her having a drain in place, higher risk of bile leak and increased incidence of needing additional procedures like ERCP. The patient's history has been reviewed, she denies any changes in her health or health history since we met in the office earlier this month, patient examined.  I have reviewed the patient's chart and labs.  Questions were answered to her satisfaction, she expressed understanding and has elected to proceed with surgery.  [place]

## 2019-10-20 NOTE — Op Note (Signed)
10/20/2019 2:18 PM  PATIENT: Joy Heath  24 y.o. female  Patient Care Team: Celene Squibb, MD as PCP - General (Internal Medicine) Celene Squibb, MD (Internal Medicine)  PRE-OPERATIVE DIAGNOSIS: Symptomatic cholelithiasis  POST-OPERATIVE DIAGNOSIS: Chronic cholecystitis  PROCEDURE: Laparoscopic cholecystectomy  SURGEON: Sharon Mt. Burton Gahan, MD  ANESTHESIA: General endotracheal  EBL: No intake/output data recorded.  DRAINS: None  SPECIMEN: Gallbladder  COUNTS: Sponge, needle and instrument counts were reported correct x2 at the conclusion of the operation  DISPOSITION: PACU in satisfactory condition  COMPLICATIONS: None  FINDINGS: Chronic woody adhesions in triangle of Calot. A critical view of safety was able to be achieved before clipping or dividing any structures  INDICATION:  Ms. Hilligoss is a very pleasant 23yoF with history of PCOS and endometriosis whom presented to the office for evaluation of RUQ abdominal pain. She was seen and evaluated and found to have gallstones on imaging and given her classic symptoms, diagnosed with symptomatic cholelithiasis. She had previously trailed many acid suppressing medications without any change in her symptomatology. We discussed options going forward and she opted to pursue surgery. Please refer to notes elsewhere regarding these discussions.  DESCRIPTION:  The patient was identified & brought into the operating room. She was then positioned supine on the OR table. SCDs were in place and active during the entire case. She then underwent general endotracheal anesthesia. Pressure points were padded. The abdomen was prepped and draped in the standard sterile fashion. Antibiotics were administered. A surgical timeout was performed and confirmed our plan.   A supraumbilical incision was made. The umbilical stalk was grasped and retracted outwardly. The supraumbilical fascia was identified and incised. The peritoneal cavity was  gently entered bluntly. A purse-string 0 Vicryl suture was placed. The Hasson cannula was inserted into the peritoneal cavity and insufflation with CO2 commenced to 16mmHg. A laparoscope was inserted into the peritoneal cavity and inspection confirmed no evidence of trocar site complications. The patient was then positioned in reverse Trendelenburg with slight left side down. 3 additional 15mm trocars were placed along the right subcostal line - one 83mm port in mid subcostal region, another 8mm port in the right flank near the anterior axillary line, and a third 31mm port in the left subxiphoid region obliquely near the falciform ligament.  The liver and gallbladder were inspected. Liver with rounded edges consistent with steatotic appearance to liver. The gallbladder fundus was grasped and elevated cephalad.  An additional grasper was then placed on the infundibulum of the gallbladder and the infundibulum was retracted laterally. Staying high on the gallbladder, the peritoneum on both sides of the gallbladder was opened with hook cautery. Cautious blunt dissection was then employed with a IT consultant working down into [employer]. The cystic duct was identified and carefully circumferentially dissected. There was fibrotic and somewhat neovascularized adhesions within the triangle consistent with 'chronic' cholecystitis vs prior bouts of cholecystitis. The cystic artery was also identified and carefully circumferentially dissected. The space between the cystic artery and hepatocystic plate was developed such that a good view of the liver could be seen through a window medial to the cystic artery. The triangle of Calot had been cleared of all fibrofatty tissue. There were no structures diving back towards the liver medial to the artery. At this point, a critical view of safety was achieved and the only structures visualized was the skeletonized cystic duct laterally, the skeletonized cystic artery and the  liver through the window medial to the artery. No posterior  cystic artery was noted  A cholangiogram was not performed at this point as her liver made cephalad retraction challenging and with partial ductotomy, could result in duct avulsion.  The cystic duct and artery were clipped with 2 clips on the patient side and 1 clip on the specimen side. The cystic duct and artery were then divided. The gallbladder was then freed from its remaining attachments to the liver using electrocautery and placed into an endocatch bag. The RUQ was gently irrigated with sterile saline. Hemostasis was then verified. The clips were in good position; the gallbladder fossa was dry. The rest of the abdomen was inspected no injury nor bleeding elsewhere was identified.  The endocatch bag containing the gallbladder was then removed from the umbilical port site and passed off as specimen. The RUQ ports were removed under direct visualization and noted to be hemostatic. The umbilical fascia was then closed using the 0 Vicryl purse-string suture. The fascia was palpated and noted to be completely closed. The skin of all incision sites was approximated with 4-0 monocryl subcuticular suture and dermabond applied. She was then awakened from anesthesia, extubated, and transferred to a stretcher for transport to PACU in satisfactory condition.

## 2019-10-20 NOTE — Transfer of Care (Signed)
Immediate Anesthesia Transfer of Care Note  Patient: Joy Heath  Procedure(s) Performed: LAPAROSCOPIC CHOLECYSTECTOMY (N/A )  Patient Location: PACU  Anesthesia Type:General  Level of Consciousness: drowsy  Airway & Oxygen Therapy: Patient Spontanous Breathing and Patient connected to face mask oxygen  Post-op Assessment: Report given to RN and Post -op Vital signs reviewed and stable  Post vital signs: Reviewed and stable  Last Vitals:  Vitals Value Taken Time  BP 125/65 10/20/19 1434  Temp    Pulse 64 10/20/19 1435  Resp 15 10/20/19 1435  SpO2 100 % 10/20/19 1435  Vitals shown include unvalidated device data.  Last Pain:  Vitals:   10/20/19 1121  TempSrc:   PainSc: 4       Patients Stated Pain Goal: 5 ([id number])  Complications: No apparent anesthesia complications

## 2019-10-20 NOTE — Anesthesia Procedure Notes (Signed)
Procedure Name: Intubation Date/Time: 10/20/2019 12:51 PM Performed by: Garrel Ridgel, CRNA Pre-anesthesia Checklist: Patient identified, Emergency Drugs available, Suction available and Patient being monitored Patient Re-evaluated:Patient Re-evaluated prior to induction Oxygen Delivery Method: Circle system utilized Preoxygenation: Pre-oxygenation with 100% oxygen Induction Type: IV induction Ventilation: Mask ventilation without difficulty Laryngoscope Size: Mac and 3 Grade View: Grade I Tube type: Oral Tube size: 7.0 mm Number of attempts: 1 Airway Equipment and Method: Stylet and Oral airway Placement Confirmation: ETT inserted through vocal cords under direct vision,  positive ETCO2 and breath sounds checked- equal and bilateral Secured at: 22 cm Tube secured with: Tape Dental Injury: Teeth and Oropharynx as per pre-operative assessment

## 2019-10-20 NOTE — Anesthesia Preprocedure Evaluation (Addendum)
Anesthesia Evaluation  Patient identified by MRN, date of birth, ID band Patient awake    Reviewed: Allergy & Precautions, NPO status , Patient's Chart, lab work & pertinent test results  History of Anesthesia Complications Negative for: history of anesthetic complications  Airway Mallampati: II  TM Distance: >3 FB Neck ROM: Full    Dental  (+) Dental Advisory Given, Teeth Intact   Pulmonary neg pulmonary ROS,    Pulmonary exam normal        Cardiovascular Normal cardiovascular exam   Hx POTS    Neuro/Psych  Headaches, negative psych ROS   GI/Hepatic Neg liver ROS, GERD  Medicated and Controlled,  Endo/Other  Hypothyroidism (no longer on meds) Morbid obesity  Renal/GU Renal InsufficiencyRenal disease     Musculoskeletal negative musculoskeletal ROS (+)   Abdominal   Peds  Hematology negative hematology ROS (+)   Anesthesia Other Findings Covid neg 10/16/19  Reproductive/Obstetrics                            Anesthesia Physical Anesthesia Plan  ASA: III  Anesthesia Plan: General   Post-op Pain Management:    Induction: Intravenous  PONV Risk Score and Plan: 4 or greater and Treatment may vary due to age or medical condition, Ondansetron, Midazolam, Dexamethasone and Scopolamine patch - Pre-op  Airway Management Planned: Oral ETT  Additional Equipment: None  Intra-op Plan:   Post-operative Plan: Extubation in OR  Informed Consent: I have reviewed the patients History and Physical, chart, labs and discussed the procedure including the risks, benefits and alternatives for the proposed anesthesia with the patient or authorized representative who has indicated his/her understanding and acceptance.     Dental advisory given  Plan Discussed with: CRNA and Anesthesiologist  Anesthesia Plan Comments:        Anesthesia Quick Evaluation

## 2019-10-20 NOTE — Anesthesia Postprocedure Evaluation (Signed)
Anesthesia Post Note  Patient: Melina Kovar  Procedure(s) Performed: LAPAROSCOPIC CHOLECYSTECTOMY (N/A )     Patient location during evaluation: PACU Anesthesia Type: General Level of consciousness: awake and alert Pain management: pain level controlled Vital Signs Assessment: post-procedure vital signs reviewed and stable Respiratory status: spontaneous breathing, nonlabored ventilation, respiratory function stable and patient connected to nasal cannula oxygen Cardiovascular status: blood pressure returned to baseline and stable Postop Assessment: no apparent nausea or vomiting Anesthetic complications: no    Last Vitals:  Vitals:   10/20/19 1630 10/20/19 1645  BP: (!) 103/57 110/69  Pulse: 71 75  Resp: 16 18  Temp: 36.7 C   SpO2: 93% 93%    Last Pain:  Vitals:   10/20/19 1630  TempSrc:   PainSc: [place]

## 2019-10-21 LAB — SURGICAL PATHOLOGY

## 2019-11-07 ENCOUNTER — Encounter: Payer: BC Managed Care – PPO | Admitting: Gastroenterology

## 2019-11-20 ENCOUNTER — Other Ambulatory Visit: Payer: Self-pay

## 2019-11-20 ENCOUNTER — Ambulatory Visit (AMBULATORY_SURGERY_CENTER): Payer: BC Managed Care – PPO | Admitting: Gastroenterology

## 2019-11-20 ENCOUNTER — Encounter: Payer: Self-pay | Admitting: Gastroenterology

## 2019-11-20 ENCOUNTER — Other Ambulatory Visit: Payer: Self-pay | Admitting: *Deleted

## 2019-11-20 VITALS — BP 109/67 | HR 74 | Temp 97.3°F | Resp 13 | Ht 65.0 in | Wt 284.0 lb

## 2019-11-20 DIAGNOSIS — K641 Second degree hemorrhoids: Secondary | ICD-10-CM | POA: Diagnosis not present

## 2019-11-20 DIAGNOSIS — K259 Gastric ulcer, unspecified as acute or chronic, without hemorrhage or perforation: Secondary | ICD-10-CM

## 2019-11-20 DIAGNOSIS — K5909 Other constipation: Secondary | ICD-10-CM

## 2019-11-20 DIAGNOSIS — K219 Gastro-esophageal reflux disease without esophagitis: Secondary | ICD-10-CM | POA: Diagnosis not present

## 2019-11-20 DIAGNOSIS — K2970 Gastritis, unspecified, without bleeding: Secondary | ICD-10-CM | POA: Diagnosis not present

## 2019-11-20 DIAGNOSIS — K635 Polyp of colon: Secondary | ICD-10-CM

## 2019-11-20 DIAGNOSIS — K2950 Unspecified chronic gastritis without bleeding: Secondary | ICD-10-CM | POA: Diagnosis not present

## 2019-11-20 DIAGNOSIS — B9681 Helicobacter pylori [H. pylori] as the cause of diseases classified elsewhere: Secondary | ICD-10-CM | POA: Diagnosis not present

## 2019-11-20 DIAGNOSIS — D122 Benign neoplasm of ascending colon: Secondary | ICD-10-CM

## 2019-11-20 MED ORDER — PANTOPRAZOLE SODIUM 40 MG PO TBEC: DELAYED_RELEASE_TABLET | ORAL | 3 refills | Status: AC

## 2019-11-20 MED ORDER — SODIUM CHLORIDE 0.9 % IV SOLN: 500.0000 mL | Freq: Once | INTRAVENOUS | Status: DC

## 2019-11-20 NOTE — Op Note (Signed)
[place] Patient Name: Joy Heath Procedure Date: 11/20/2019 1:57 PM MRN: [id number] Endoscopist: Justice Britain , MD Age: 24 Referring MD:  Date of Birth: September 09, 1995 Gender: Female Account #: [id number] Procedure:                Colonoscopy Indications:              Generalized abdominal pain, Constipation Medicines:                Monitored Anesthesia Care Procedure:                Pre-Anesthesia Assessment:                           - Prior to the procedure, a History and Physical                            was performed, and patient medications and                            allergies were reviewed. The patient's tolerance of                            previous anesthesia was also reviewed. The risks                            and benefits of the procedure and the sedation                            options and risks were discussed with the patient.                            All questions were answered, and informed consent                            was obtained. Prior Anticoagulants: The patient has                            taken no previous anticoagulant or antiplatelet                            agents except for NSAID medication. ASA Grade                            Assessment: II - A patient with mild systemic                            disease. After reviewing the risks and benefits,                            the patient was deemed in satisfactory condition to                            undergo the procedure.  After obtaining informed consent, the colonoscope                            was passed under direct vision. Throughout the                            procedure, the patient's blood pressure, pulse, and                            oxygen saturations were monitored continuously. The                            Colonoscope was introduced through the anus and                            advanced to the 5 cm into the  ileum. The                            colonoscopy was performed without difficulty. The                            patient tolerated the procedure. The quality of the                            bowel preparation was good. The terminal ileum,                            ileocecal valve, appendiceal orifice, and rectum                            were photographed. Scope In: 2:20:17 PM Scope Out: 2:43:13 PM Scope Withdrawal Time: 0 hours 19 minutes 38 seconds  Total Procedure Duration: 0 hours 22 minutes 56 seconds  Findings:                 The digital rectal exam findings include                            hemorrhoids. Pertinent negatives include no                            palpable rectal lesions.                           The terminal ileum and ileocecal valve appeared                            normal.                           A 20 mm polyp was found in the proximal ascending                            colon. The polyp was sessile. Preparations were  made for mucosal resection. Saline was injected to                            raise the lesion. Piecemeal mucosal resection using                            a snare was performed. Resection and retrieval were                            complete.                           Normal mucosa was found in the entire colon                            otherwise.                           Non-bleeding non-thrombosed internal hemorrhoids                            were found during retroflexion, during perianal                            exam and during digital exam. The hemorrhoids were                            Grade II (internal hemorrhoids that prolapse but                            reduce spontaneously). Complications:            No immediate complications. Estimated Blood Loss:     Estimated blood loss was minimal. Impression:               - Hemorrhoids found on digital rectal exam.                           - The  examined portion of the ileum was normal.                           - One 20 mm polyp in the proximal ascending colon,                            removed with piecemeal cold mucosal resection.                            Resected and retrieved.                           - Normal mucosa in the entire examined colon                            otherwise.                           -  Non-bleeding non-thrombosed internal hemorrhoids. Recommendation:           - The patient will be observed post-procedure,                            until all discharge criteria are met.                           - Discharge patient to home.                           - Patient has a contact number available for                            emergencies. The signs and symptoms of potential                            delayed complications were discussed with the                            patient. Return to normal activities tomorrow.                            Written discharge instructions were provided to the                            patient.                           - High fiber diet.                           - Use FiberCon 1-2 tablets PO daily.                           - Restart Linzess daily tomorrow.                           - Continue present medications.                           - Await pathology results.                           - Repeat colonoscopy in 1 year for surveillance to                            ensure no recurrent polyp.                           - The findings and recommendations were discussed                            with the patient. Justice Britain, MD 11/20/2019 2:56:12 PM

## 2019-11-20 NOTE — Patient Instructions (Signed)
High Fiber Diet Use FiberCon 1-2 tablets daily RESTART Linzess daily tomorrow Increase Protonix 40 mg twice daily for 1 month and then decrease back to once daily  YOU HAD AN ENDOSCOPIC PROCEDURE TODAY AT [facility]:   Refer to the procedure report that was given to you for any specific questions about what was found during the examination.  If the procedure report does not answer your questions, please call your gastroenterologist to clarify.  If you requested that your care partner not be given the details of your procedure findings, then the procedure report has been included in a sealed envelope for you to review at your convenience later.  YOU SHOULD EXPECT: Some feelings of bloating in the abdomen. Passage of more gas than usual.  Walking can help get rid of the air that was put into your GI tract during the procedure and reduce the bloating. If you had a lower endoscopy (such as a colonoscopy or flexible sigmoidoscopy) you may notice spotting of blood in your stool or on the toilet paper. If you underwent a bowel prep for your procedure, you may not have a normal bowel movement for a few days.  Please Note:  You might notice some irritation and congestion in your nose or some drainage.  This is from the oxygen used during your procedure.  There is no need for concern and it should clear up in a day or so.  SYMPTOMS TO REPORT IMMEDIATELY:   Following lower endoscopy (colonoscopy or flexible sigmoidoscopy):  Excessive amounts of blood in the stool  Significant tenderness or worsening of abdominal pains  Swelling of the abdomen that is new, acute  Fever of 100F or higher   Following upper endoscopy (EGD)  Vomiting of blood or coffee ground material  New chest pain or pain under the shoulder blades  Painful or persistently difficult swallowing  New shortness of breath  Fever of 100F or higher  Black, tarry-looking stools  For urgent or emergent issues, a  gastroenterologist can be reached at any hour by calling [phone]. Do not use MyChart messaging for urgent concerns.    DIET:  We do recommend a small meal at first, but then you may proceed to your regular diet.  Drink plenty of fluids but you should avoid alcoholic beverages for 24 hours.  ACTIVITY:  You should plan to take it easy for the rest of today and you should NOT DRIVE or use heavy machinery until tomorrow (because of the sedation medicines used during the test).    FOLLOW UP: Our staff will call the number listed on your records 48-72 hours following your procedure to check on you and address any questions or concerns that you may have regarding the information given to you following your procedure. If we do not reach you, we will leave a message.  We will attempt to reach you two times.  During this call, we will ask if you have developed any symptoms of COVID 19. If you develop any symptoms (ie: fever, flu-like symptoms, shortness of breath, cough etc.) before then, please call [phone].  If you test positive for Covid 19 in the 2 weeks post procedure, please call and report this information to [place].    If any biopsies were taken you will be contacted by phone or by letter within the next 1-3 weeks.  Please call us at [phone] if you have not heard about the biopsies in 3 weeks.    SIGNATURES/CONFIDENTIALITY: You  and/or your care partner have signed paperwork which will be entered into your electronic medical record.  These signatures attest to the fact that that the information above on your After Visit Summary has been reviewed and is understood.  Full responsibility of the confidentiality of this discharge information lies with you and/or your care-partner.

## 2019-11-20 NOTE — Op Note (Signed)
[place] Patient Name: Joy Heath Procedure Date: 11/20/2019 1:58 PM MRN: [id number] Endoscopist: Justice Britain , MD Age: 24 Referring MD:  Date of Birth: [birth year] Gender: Female Account #: [id number] Procedure:                Upper GI endoscopy Indications:              Abdominal pain in the right upper quadrant,                            Abdominal bloating, Early satiety, S/P                            Cholecystectomy with significant improvement in                            nausea but still slightly present Procedure:                Pre-Anesthesia Assessment:                           - Prior to the procedure, a History and Physical                            was performed, and patient medications and                            allergies were reviewed. The patient's tolerance of                            previous anesthesia was also reviewed. The risks                            and benefits of the procedure and the sedation                            options and risks were discussed with the patient.                            All questions were answered, and informed consent                            was obtained. Prior Anticoagulants: The patient has                            taken no previous anticoagulant or antiplatelet                            agents except for NSAID medication. ASA Grade                            Assessment: II - A patient with mild systemic                            disease. After reviewing the risks and benefits,  the patient was deemed in satisfactory condition to                            undergo the procedure.                           After obtaining informed consent, the endoscope was                            passed under direct vision. Throughout the                            procedure, the patient's blood pressure, pulse, and                            oxygen saturations were  monitored continuously. The                            Endoscope was introduced through the mouth, and                            advanced to the second part of duodenum. The upper                            GI endoscopy was accomplished without difficulty.                            The patient tolerated the procedure. Scope In: Scope Out: Findings:                 No gross mucosal lesions were noted in the entire                            esophagus.                           The Z-line was regular and was found 37 cm from the                            incisors.                           Patchy mild inflammation characterized by erosions                            and erythema was found in the gastric body, at the                            incisura and in the gastric antrum.                           No other gross lesions were noted in the entire                            examined stomach. Biopsies were taken with  a cold                            forceps for histology and Helicobacter pylori                            testing.                           No gross lesions were noted in the duodenal bulb,                            in the first portion of the duodenum and in the                            second portion of the duodenum. Biopsies were taken                            with a cold forceps for histology to rule out                            enteropathy. Complications:            No immediate complications. Estimated Blood Loss:     Estimated blood loss was minimal. Impression:               - No gross lesions in esophagus. Z-line regular, 37                            cm from the incisors.                           - Gastritis. Biopsied.                           - No gross lesions in the duodenal bulb, in the                            first portion of the duodenum and in the second                            portion of the duodenum. Biopsied. Recommendation:            - Proceed to scheduled colonoscopy.                           - No aspirin, ibuprofen, naproxen, or other                            non-steroidal anti-inflammatory drugs.                           - Await pathology results.                           - Increase PPI to 40 mg twice daily for next  97-month and then decrease back to once daily.                           - Query SF-GES in future.                           - The findings and recommendations were discussed                            with the patient. Justice Britain, MD 11/20/2019 2:52:05 PM

## 2019-11-20 NOTE — Progress Notes (Signed)
pt tolerated well. VSS. awake and to recovery. Report given to RN. Bite block inserted and removed without trauma. 

## 2019-11-20 NOTE — Progress Notes (Signed)
Called to room to assist during endoscopic procedure.  Patient ID and intended procedure confirmed with present staff. Received instructions for my participation in the procedure from the performing physician.  

## 2019-11-24 ENCOUNTER — Telehealth: Payer: Self-pay | Admitting: *Deleted

## 2019-11-24 NOTE — Telephone Encounter (Signed)
°  Follow up Call-  Call back number 11/20/2019  Post procedure Call Back phone  # [phone]  Permission to leave phone message Yes  Some recent data might be hidden     Patient questions:  Do you have a fever, pain , or abdominal swelling? No. Pain Score  0 *  Have you tolerated food without any problems? Yes.    Have you been able to return to your normal activities? Yes.    Do you have any questions about your discharge instructions: Diet   No. Medications  No. Follow up visit  No.  Do you have questions or concerns about your Care? No.  Actions: * If pain score is 4 or above: 1. No action needed, pain <4.Have you developed a fever since your procedure? no  2.   Have you had an respiratory symptoms (SOB or cough) since your procedure? no  3.   Have you tested positive for COVID 19 since your procedure no  4.   Have you had any family members/close contacts diagnosed with the COVID 19 since your procedure?  no   If yes to any of these questions please route to Joylene John, RN and Erenest Rasher, RN

## 2019-12-08 ENCOUNTER — Other Ambulatory Visit: Payer: Self-pay

## 2019-12-08 DIAGNOSIS — A048 Other specified bacterial intestinal infections: Secondary | ICD-10-CM

## 2019-12-08 DIAGNOSIS — Q400 Congenital hypertrophic pyloric stenosis: Secondary | ICD-10-CM

## 2019-12-08 MED ORDER — AMOXICILLIN 500 MG PO TABS: 1000.0000 mg | ORAL_TABLET | Freq: Two times a day (BID) | ORAL | 0 refills | Status: AC

## 2019-12-08 MED ORDER — METRONIDAZOLE 250 MG PO TABS: 500.0000 mg | ORAL_TABLET | Freq: Four times a day (QID) | ORAL | 0 refills | Status: AC

## 2019-12-08 MED ORDER — CLARITHROMYCIN 500 MG PO TABS: 500.0000 mg | ORAL_TABLET | Freq: Two times a day (BID) | ORAL | 0 refills | Status: AC

## 2019-12-15 ENCOUNTER — Telehealth: Payer: Self-pay | Admitting: Gastroenterology

## 2019-12-16 ENCOUNTER — Encounter: Payer: Self-pay | Admitting: Gastroenterology

## 2019-12-16 MED ORDER — ONDANSETRON 8 MG PO TBDP
8.0000 mg | ORAL_TABLET | Freq: Three times a day (TID) | ORAL | 0 refills | Status: DC | PRN
Start: 2019-12-16 — End: 2020-01-19

## 2019-12-16 NOTE — Telephone Encounter (Addendum)
Prescription for Zofran sent to [facility]. Pt has been informed.

## 2019-12-22 ENCOUNTER — Other Ambulatory Visit: Payer: Self-pay

## 2020-01-19 ENCOUNTER — Other Ambulatory Visit: Payer: Self-pay

## 2020-01-19 MED ORDER — ONDANSETRON 8 MG PO TBDP: 8.0000 mg | ORAL_TABLET | Freq: Three times a day (TID) | ORAL | 0 refills | Status: AC | PRN

## 2020-06-26 IMAGING — CT CT ANGIOGRAPHY CHEST
2 of 6 series · 18 of 46 positions shown · IV contrast (Isovue)
Comparison: Chest radiograph dated 12/17/2018

CLINICAL DATA: 23-year-old female with shortness of breath and
chest pain.

EXAM:
CT ANGIOGRAPHY CHEST WITH CONTRAST
TECHNIQUE: Multidetector CT imaging of the chest was performed using the
standard protocol during bolus administration of intravenous
contrast. Multiplanar CT image reconstructions and MIPs were
obtained to evaluate the vascular anatomy.
CONTRAST:  100mL OMNIPAQUE IOHEXOL 350 MG/ML SOLN

[Series 6: 3 thins · axial · 0.73mm/px · z∈[+1174,+1404]mm · 15 of 253 slices shown]
[im 11/253  lung]
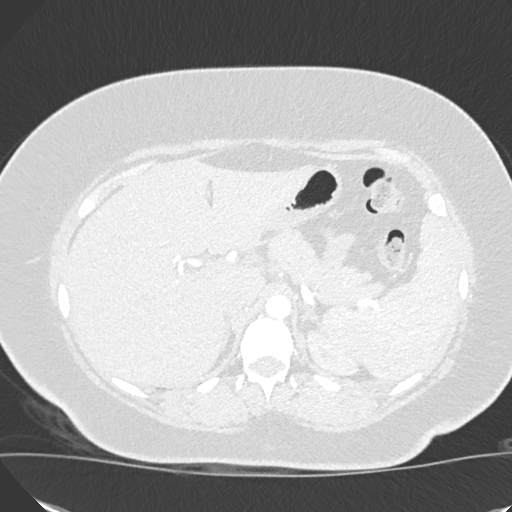
[im 33/253  soft-tissue]
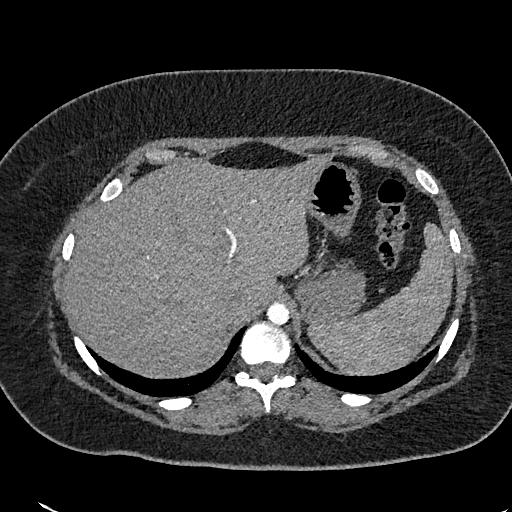
[im 44/253  lung]
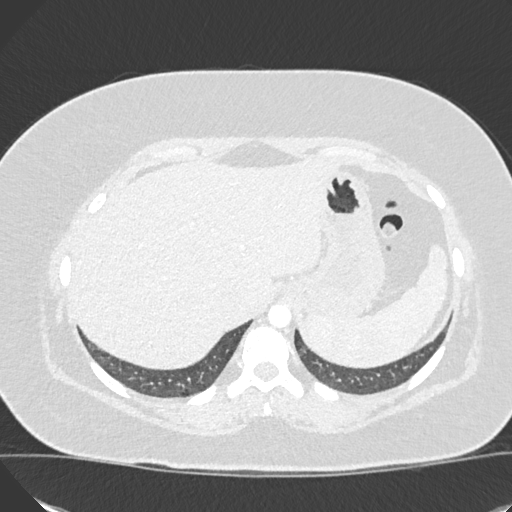
[im 66/253  soft-tissue]
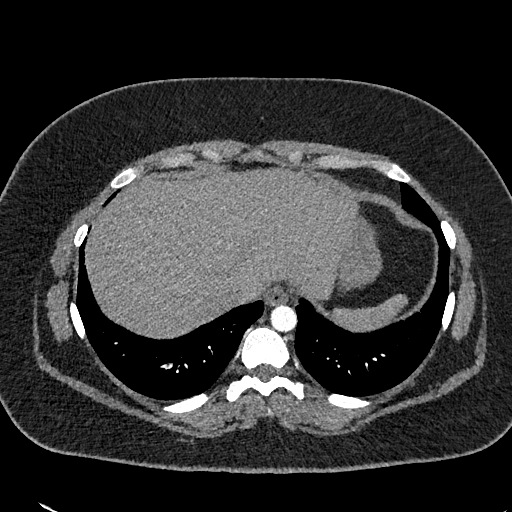
[im 77/253  lung]
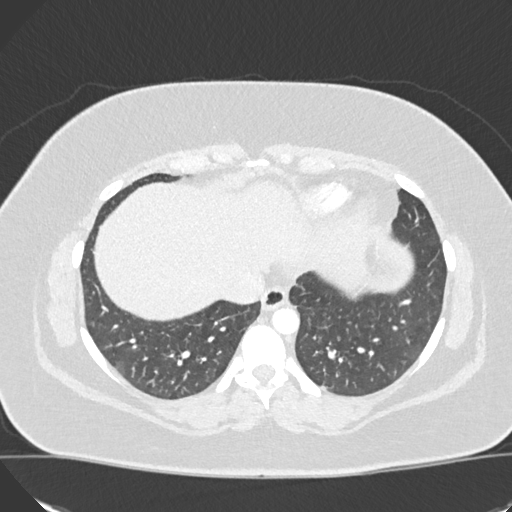
[im 99/253  soft-tissue]
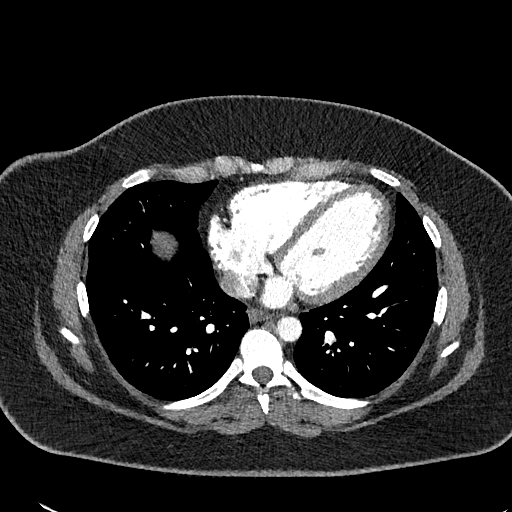
[im 110/253  lung]
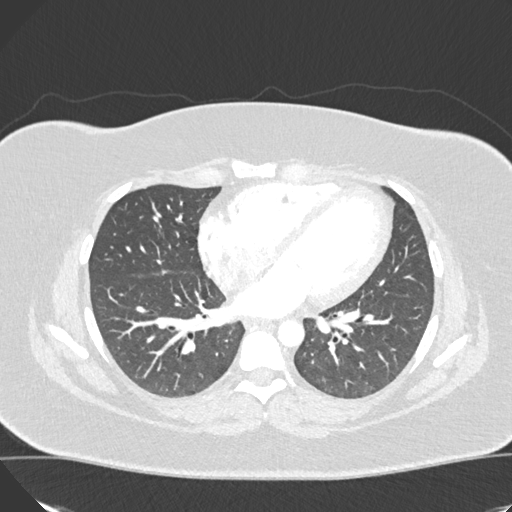
[im 132/253  soft-tissue]
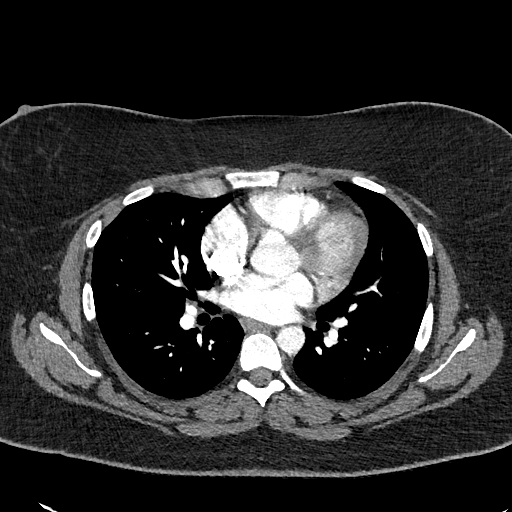
[im 143/253  lung]
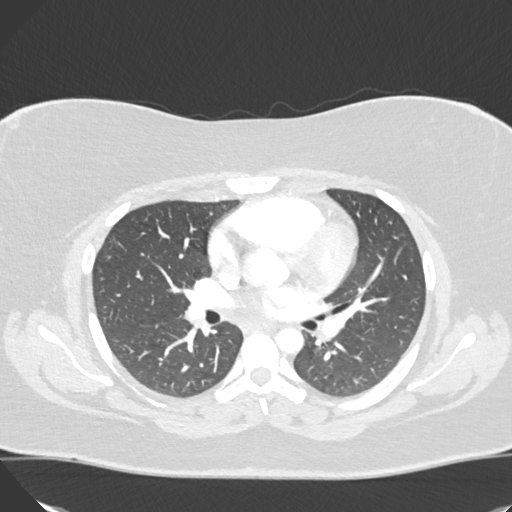
[im 154/253  soft-tissue]
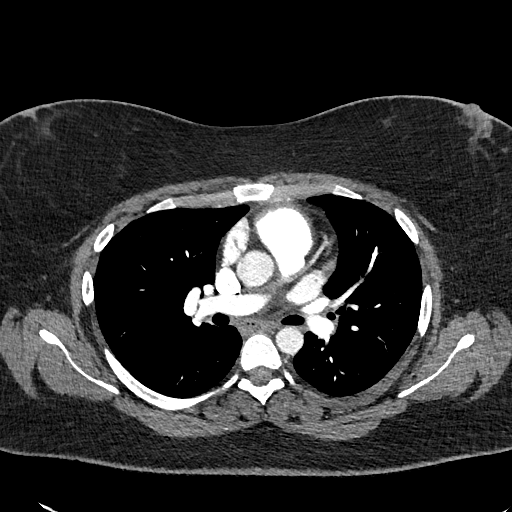
[im 176/253  lung]
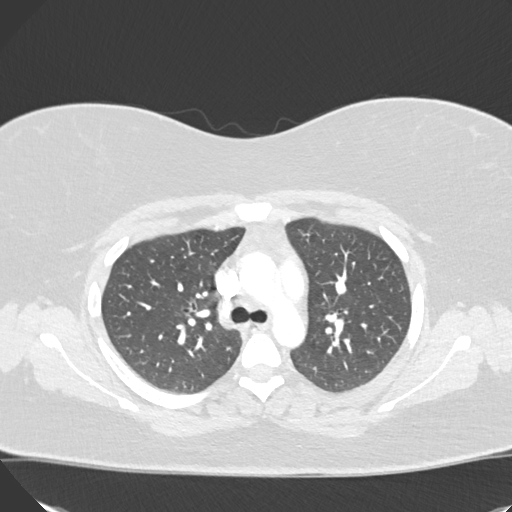
[im 187/253  soft-tissue]
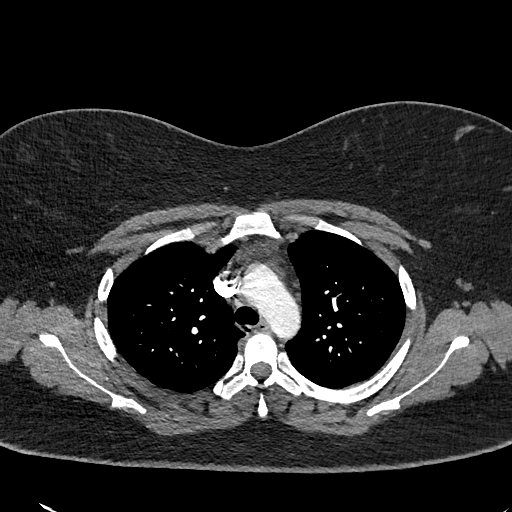
[im 209/253  lung]
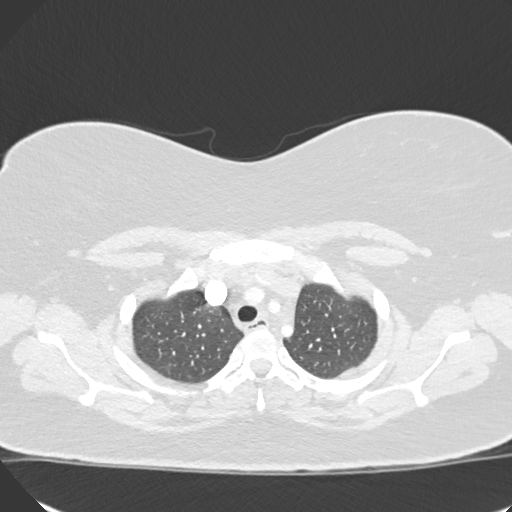
[im 220/253  soft-tissue]
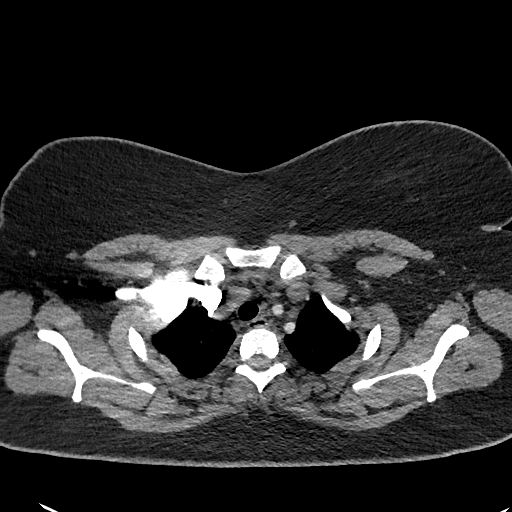
[im 242/253  lung]
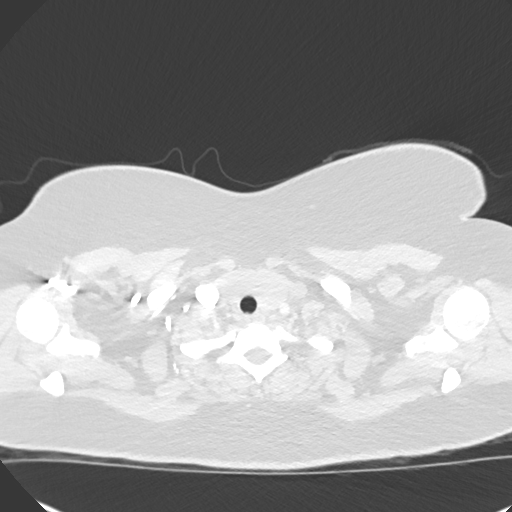

[Series 9: coronal mpr · coronal · 0.51mm/px · 3 of 151 slices shown]
[im 38/151  soft-tissue]
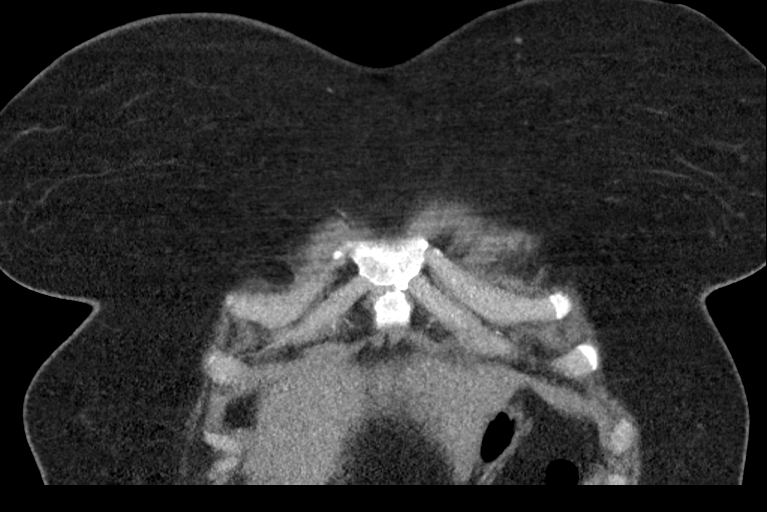
[im 76/151  soft-tissue]
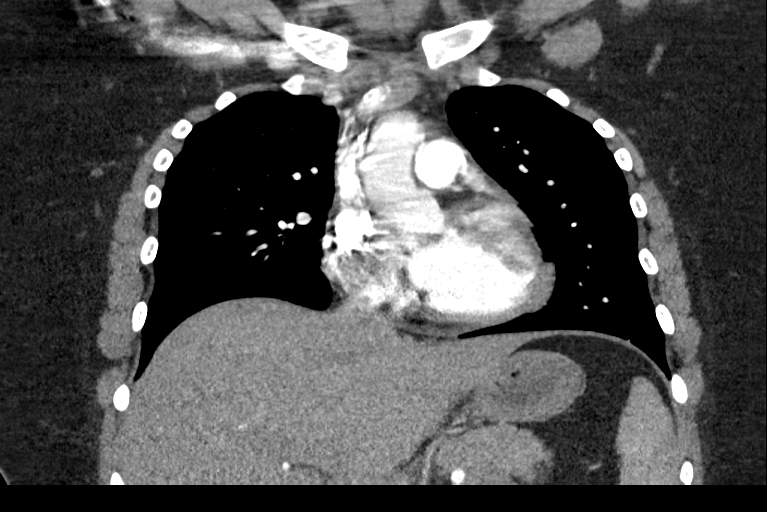
[im 113/151  soft-tissue]
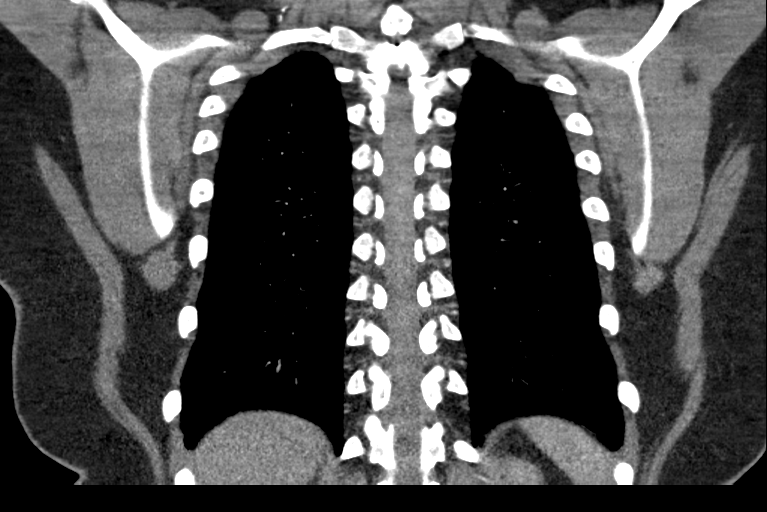

[18 of 46 positions shown; findings below may reference images not displayed]

FINDINGS: Cardiovascular: Top-normal cardiac size. No pericardial effusion.
The thoracic aorta is unremarkable. Evaluation of the pulmonary
arteries is somewhat limited due to suboptimal opacification and
timing of the contrast. No pulmonary artery embolus identified.

Mediastinum/Nodes: There is no hilar or mediastinal adenopathy.
Esophagus and the thyroid gland are grossly unremarkable. No
mediastinal fluid collection. Residual thymic tissue noted in the
anterior mediastinum.

Lungs/Pleura: The lungs are clear. There is no pleural effusion or
pneumothorax. The central airways are patent.

Upper Abdomen: No acute abnormality.

Musculoskeletal: No chest wall abnormality. No acute or significant
osseous findings.

Review of the MIP images confirms the above findings.
IMPRESSION: No acute intrathoracic pathology. No CT evidence of pulmonary
embolism.
